# Patient Record
Sex: Female | Born: 1975 | Race: White | Hispanic: Yes | Marital: Single | Smoking: Former smoker
Health system: Southern US, Community
[De-identification: ages and names within clinical notes are randomized; demographics above are authoritative.]

## PROBLEM LIST (undated history)

## (undated) DIAGNOSIS — O139 Gestational [pregnancy-induced] hypertension without significant proteinuria, unspecified trimester: Secondary | ICD-10-CM

## (undated) DIAGNOSIS — B977 Papillomavirus as the cause of diseases classified elsewhere: Secondary | ICD-10-CM

## (undated) DIAGNOSIS — T7840XA Allergy, unspecified, initial encounter: Secondary | ICD-10-CM

## (undated) DIAGNOSIS — N890 Mild vaginal dysplasia: Secondary | ICD-10-CM

## (undated) HISTORY — PX: MYOMECTOMY ABDOMINAL APPROACH: SUR870

## (undated) HISTORY — DX: Papillomavirus as the cause of diseases classified elsewhere: B97.7

## (undated) HISTORY — DX: Allergy, unspecified, initial encounter: T78.40XA

## (undated) HISTORY — DX: Mild vaginal dysplasia: N89.0

---

## 2000-01-28 ENCOUNTER — Other Ambulatory Visit: Admission: RE | Admit: 2000-01-28 | Discharge: 2000-01-28 | Payer: Self-pay | Admitting: Gynecology

## 2001-12-13 ENCOUNTER — Inpatient Hospital Stay (HOSPITAL_COMMUNITY): Admission: EM | Admit: 2001-12-13 | Discharge: 2001-12-15 | Payer: Self-pay | Admitting: Psychiatry

## 2002-07-05 ENCOUNTER — Other Ambulatory Visit: Admission: RE | Admit: 2002-07-05 | Discharge: 2002-07-05 | Payer: Self-pay | Admitting: Gynecology

## 2002-08-20 ENCOUNTER — Inpatient Hospital Stay (HOSPITAL_COMMUNITY): Admission: RE | Admit: 2002-08-20 | Discharge: 2002-08-22 | Payer: Self-pay | Admitting: Gynecology

## 2002-08-20 ENCOUNTER — Encounter (INDEPENDENT_AMBULATORY_CARE_PROVIDER_SITE_OTHER): Payer: Self-pay

## 2005-04-21 ENCOUNTER — Other Ambulatory Visit: Admission: RE | Admit: 2005-04-21 | Discharge: 2005-04-21 | Payer: Self-pay | Admitting: Gynecology

## 2005-06-01 ENCOUNTER — Ambulatory Visit (HOSPITAL_BASED_OUTPATIENT_CLINIC_OR_DEPARTMENT_OTHER): Admission: RE | Admit: 2005-06-01 | Discharge: 2005-06-01 | Payer: Self-pay | Admitting: Gynecology

## 2005-06-01 HISTORY — PX: OTHER SURGICAL HISTORY: SHX169

## 2005-12-06 ENCOUNTER — Other Ambulatory Visit: Admission: RE | Admit: 2005-12-06 | Discharge: 2005-12-06 | Payer: Self-pay | Admitting: Gynecology

## 2006-07-13 ENCOUNTER — Other Ambulatory Visit: Admission: RE | Admit: 2006-07-13 | Discharge: 2006-07-13 | Payer: Self-pay | Admitting: Gynecology

## 2007-01-19 ENCOUNTER — Encounter (INDEPENDENT_AMBULATORY_CARE_PROVIDER_SITE_OTHER): Payer: Self-pay | Admitting: Specialist

## 2007-01-19 ENCOUNTER — Inpatient Hospital Stay (HOSPITAL_COMMUNITY): Admission: RE | Admit: 2007-01-19 | Discharge: 2007-01-22 | Payer: Self-pay | Admitting: Gynecology

## 2007-02-22 ENCOUNTER — Other Ambulatory Visit: Admission: RE | Admit: 2007-02-22 | Discharge: 2007-02-22 | Payer: Self-pay | Admitting: Gynecology

## 2009-05-13 ENCOUNTER — Ambulatory Visit: Payer: Self-pay | Admitting: Gynecology

## 2009-10-22 ENCOUNTER — Ambulatory Visit: Payer: Self-pay | Admitting: Gynecology

## 2009-10-22 ENCOUNTER — Other Ambulatory Visit: Admission: RE | Admit: 2009-10-22 | Discharge: 2009-10-22 | Payer: Self-pay | Admitting: Gynecology

## 2011-02-10 ENCOUNTER — Other Ambulatory Visit: Payer: Self-pay | Admitting: Gynecology

## 2011-02-10 ENCOUNTER — Other Ambulatory Visit (HOSPITAL_COMMUNITY)
Admission: RE | Admit: 2011-02-10 | Discharge: 2011-02-10 | Disposition: A | Discharge status: Home / Self Care | Payer: Self-pay | Source: Ambulatory Visit | Attending: Gynecology | Admitting: Gynecology

## 2011-02-10 ENCOUNTER — Encounter (INDEPENDENT_AMBULATORY_CARE_PROVIDER_SITE_OTHER): Payer: Self-pay | Admitting: Gynecology

## 2011-02-10 DIAGNOSIS — Z01419 Encounter for gynecological examination (general) (routine) without abnormal findings: Secondary | ICD-10-CM

## 2011-02-10 DIAGNOSIS — R5383 Other fatigue: Secondary | ICD-10-CM

## 2011-02-10 DIAGNOSIS — Z833 Family history of diabetes mellitus: Secondary | ICD-10-CM

## 2011-02-10 DIAGNOSIS — Z13220 Encounter for screening for lipoid disorders: Secondary | ICD-10-CM

## 2011-02-10 DIAGNOSIS — Z124 Encounter for screening for malignant neoplasm of cervix: Secondary | ICD-10-CM | POA: Insufficient documentation

## 2011-04-08 NOTE — Op Note (Signed)
NAME:  Brittany Frost, Brittany Frost NO.:  [id number]   MEDICAL RECORD NO.:  [id number]          PATIENT TYPE:  AMB   LOCATION:  [facility]                         FACILITY:  [facility]   PHYSICIAN:  Juan H. Lily Peer, M.D.DATE OF BIRTH:  13-Dec-1975   DATE OF PROCEDURE:  06/01/2005  DATE OF DISCHARGE:                                 OPERATIVE REPORT   INDICATIONS FOR OPERATION:  A 35 year old gravida 2, para 0, AB 2 with  abnormal Pap smear in the office demonstrated CIN I/VAIN I with HPV changes  and hybrid capture demonstrated high risk HPV. The patient underwent  colposcopic directed biopsy which demonstrated in the right and left vaginal  anterior fornix VAIN I and and no ectocervical lesions were noted and ECC  was benign. She also had perirectal and perineum condyloma acuminatum as  well as near the area of the fourchette. Her ECC was benign.   PREOPERATIVE DIAGNOSES:  1.  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  Perineal and perirectal condyloma acuminatum.   ANESTHESIA:  General endotracheal anesthesia.   SURGEON:  Juan H. Lily Peer, M.D.   PROCEDURE PERFORMED:  Laser ablation of vulvar intraepithelial neoplasia I  of the anterior right and left vaginal fornix as well as laser ablation of  perineal and perirectal and vulvar condyloma acuminatum.   FINDINGS:  Patient with scattered condyloma acuminatum perirectal, perineum  and right and left labia majora near the area of the mons pubis.   DESCRIPTION OF OPERATION:  After the patient was adequately counseled, she  was taken to the operating room where she underwent a successful general  endotracheal anesthesia. She was placed in a high lithotomy position. Wet  drapes were placed around the vulva. A wet sponge was placed in the rectum.  Acetic acid was applied to the external genitalia and with a handheld piece  and the CO2 laser set at 10 watts with a 2 mm spot size, the external  genital lesions were  ablated/condylomas. The attention was then placed to  the vaginal vault were a titanium coated speculum was inserted. Acetic acid  was applied. A thorough inspection of the vagina and cervix demonstrated the  previous biopsy sites in the anterior vaginal fornix, the acetowhite areas  at the anterior right and left lateral vaginal fornices which were laser  ablated with similar a 10 watt setting in a brush like technique to an  approximately 2 mm depth. Amino-Cerv cream was applied intravaginally as  well as the external genitalia. The patient was awakened, transferred to  recovery room with stable vital signs. She received 30 mg of Toradol in  route to the recovery room.   IV FLUIDS:  Total 900 mL .   BLOOD LOSS:  Minimal.       JHF/MEDQ  D:  06/01/2005  T:  06/01/2005  Job:  [id number]

## 2011-04-08 NOTE — H&P (Signed)
NAME:  Brittany Frost, Brittany Frost NO.:  [id number]   MEDICAL RECORD NO.:  [id number]          PATIENT TYPE:  INP   LOCATION:  NA                            FACILITY:  [facility]   PHYSICIAN:  Juan H. Lily Peer, M.D.DATE OF BIRTH:  June 17, 1976   DATE OF ADMISSION:  DATE OF DISCHARGE:                              HISTORY & PHYSICAL   CHIEF COMPLAINT:  1. Term intrauterine pregnancy.  2. Previous history of myomectomy.  3. Labile pregnancy-induced hypertension.   HISTORY OF PRESENT ILLNESS:  The patient is a 35 year old gravida 2,  para 0, AB 1, with corrected estimated date of confinement of February 03, 2007.  Patient scheduled to undergo a primary cesarean section this  Friday, February 29, whereby the patient will be 28 weeks' gestation.  Her surgery originally had been scheduled for Thursday, March 6, but due  to her labile PIH it was moved up.  The patient's prenatal course is  significant for the fact that she had been scheduled for a cesarean  section due to the fact that she had an abdominal myomectomy in the  past.  Her prenatal care consisted of a normal first trimester screen.  She declined a cystic fibrosis screen, and alpha-fetoprotein was normal.  She had had early in her pregnancy a subchorionic hematoma, which  eventually had resolved.  She had been followed also with ultrasounds  for growth and most recent ultrasound in the office was on February 27,  whereby she had a biophysical profile because her blood pressures were  as follows:  160/110 and 130/90, but with rest it went down to 110/80  and 112/80.  Her DTRs were 1+, no clonus, no edema.  She was totally  asymptomatic.  Biophysical profile was 8/8 and her NST was reactive.  PIH labs pending at time of dictation.   PAST MEDICAL HISTORY:  Patient with previous abdominal myomectomy  whereby the intrauterine cavity had been entered and large-sized  fibroids and to prevent uterine rupture, this is the reason for  the  scheduling of the cesarean section.   She denies any allergies, and no other medical problems were reported.   REVIEW OF SYSTEMS:  See [employer] form.   PHYSICAL EXAMINATION:  VITAL SIGNS:  The patient weighs 172 pounds.  Last blood pressure reading in the office 112/80.  HEENT:  Unremarkable.  NECK:  Supple.  Trachea midline.  No carotid bruits.  No thyromegaly.  LUNGS:  Clear to auscultation without rhonchi or wheezes.  CARDIAC:  Regular rate and rhythm, no murmur or gallop.  BREASTS:  Exam not done.  ABDOMEN:  Gravid uterus.  Pfannenstiel skin incision from previous  abdominal myomectomy.  PELVIC:  Bartholin, urethra and Skene glands within normal limits.  Vagina and cervix:  No lesions or discharge.  Cervix long and closed.  Gravid uterus.  EXTREMITIES:  DTR 1+, negative clonus, trace edema.   PRENATAL LABORATORY DATA:  O positive blood type, negative antibody  screen.  VDRL was nonreactive.  Rubella immune.  Hepatitis B surface  antigen and HIV were negative.  Alpha-fetoprotein was normal and  GBS  culture was negative.   ASSESSMENT:  A 35 year old gravida 2, para 0, AB 1, who is scheduled to  undergo primary cesarean section on Friday, February 29, secondary to  labile pregnancy-induced hypertension and prior history of abdominal  myomectomy.  The patient will be 35 weeks into her pregnancy.  The risks  and benefits and pros and cons of the operation were discussed and  explained to the patient in Spanish.  All questions were answered, and  will follow accordingly.      Juan H. Lily Peer, M.D.  Electronically Signed     JHF/MEDQ  D:  01/17/2007  T:  01/17/2007  Job:  [id number]

## 2011-04-08 NOTE — Discharge Summary (Signed)
[facility]  Patient:    Brittany Frost, Brittany Frost Visit Number: [id number] MRN: [id number]          Service Type: GYN Location: *N Attending Physician:  Tonye Royalty Dictated by:   Reymundo Poll Dub Mikes, M.D. Adm. Date:  12/13/01 Disc. Date: 12/15/01                             Discharge Summary  CHIEF COMPLAINT AND PRESENT ILLNESS:  This was the first admission to [facility] for this 35 year old Hispanic female, who was admitted due to increased depression.  The patient overdosed on Zoloft after arguing with her boyfriends sister at work.  They had made fun of her.  She was upset when her boyfriend chose to side with his sister rather than with her.  She took 20-25 tablets of Zoloft.  Seen in the emergency department, medically cleared and transferred.  There is work pressure from her supervisor as an additional stressor in her life.  Three to four weeks of progressive anhedonia, poor sleep secondary to chronic worry, reclusiveness, not wanting to get up in the morning, get dressed or do anything, decreased appetite, no specific weight loss, crying, sadness for the past two or three weeks.  No auditory or visual hallucinations.  PAST PSYCHIATRIC HISTORY:  OB/GYN placed her on Zoloft.  Initially started on 25 mg, then 50 mg.  She discontinued in December.  On the 50 mg for six weeks, she saw decreased benefit.  ALCOHOL/DRUG HISTORY:  Denies the use or abuse of any substances.  MEDICAL HISTORY:  Dysmenorrhea not otherwise specified and anemia.  MEDICATIONS:  Megace 10 mg twice a day, Chromagen 1 tab daily, Zoloft (which she was not compliant).  MENTAL STATUS EXAMINATION:  Fully alert female, polite, cooperative in no acute distress.  Quite blunted affect.  Tearful at times.  Speech was normal and relevant, spontaneous, appropriate.  Mood is quite depressed, mildly hopeless.  Thought processes are logical and goal directed.  Some  suicidal ideation but can promise safety.  No psychosis.  ADMISSION DIAGNOSES: Axis I:    Major depression, recurrent. Axis II:   No diagnosis. Axis III:  1. Anemia.            2. Dysmenorrhea. Axis IV:   Moderate. Axis V:    Global Assessment of Functioning upon admission 40-45; highest            Global Assessment of Functioning in the last year 78.  LABORATORY DATA:  Serum electrolytes within normal limits.  Thyroid profile within normal limits.  Drug screen was negative for substances of abuse.  EKG was within normal limits.  HOSPITAL COURSE:  She was admitted and started intensive individual and group psychotherapy.  She was able to share the fact that the ongoing conflict in the relationship with the family of her husband is causing increased stress on her.  She was not getting much benefit from the Zoloft, so we went ahead and started Lexapro 5 mg, to increase to 10 mg.  We met with the husband.  They were willing to look into their issues.  She just wanted some validation from her husband that he is willing to work on their relationship.  On December 15, 2001, she was in full contact with reality.  Mood improved.  Affect brighter, more hopeful.  Tolerating the Lexapro well.  We went ahead and discharged to outpatient follow-up.  DISCHARGE  DIAGNOSES: Axis I:    Major depression, recurrent. Axis II:   No diagnosis. Axis III:  1. Anemia.            2. Dysmenorrhea. Axis IV:   Moderate. Axis V:    Global Assessment of Functioning upon discharge 55-60.  DISCHARGE MEDICATIONS:  Lexapro 10 mg daily.  FOLLOW-UP:  To follow up with this physician. Dictated by:   Reymundo Poll Dub Mikes, M.D. Attending Physician:  Tonye Royalty DD:  01/23/02 TD:  01/26/02 Job: [id number]

## 2011-04-08 NOTE — Op Note (Signed)
NAME:  Brittany Frost, Brittany Frost NO.:  [id number]   MEDICAL RECORD NO.:  [id number]          PATIENT TYPE:  INP   LOCATION:  9103                          FACILITY:  [facility]   PHYSICIAN:  Juan H. Lily Peer, M.D.DATE OF BIRTH:  Nov 04, 1976   DATE OF PROCEDURE:  01/19/2007  DATE OF DISCHARGE:                               OPERATIVE REPORT   SURGEON:  Juan H. Lily Peer, M.D.   INDICATIONS FOR PROCEDURE:  The patient is a 35 year old gravida 2, para  0, abortion 1, with previous myomectomy.  She was scheduled for a  primary cesarean section for next week.  She was moved up to this week,  due to the fact that she had labile PIH.  Recent PIH labs were normal.   PREOPERATIVE DIAGNOSES:  1. Term intrauterine pregnancy.  2. Previous history of myomectomy.  3. Labile pregnancy-induced hypertension.   POSTOPERATIVE DIAGNOSES:  1. Term intrauterine pregnancy.  2. Previous history of myomectomy.  3. Labile pregnancy-induced hypertension.   ANESTHESIA:  Spinal.   PROCEDURE:  Primary lower uterine segment transverse cesarean section.   FINDINGS:  A viable female infant with Apgars of 9 and 9, weighing 6  pounds 1 ounce.  A patient with anterior fundal 4 cm broad-based trans-  mural myoma noted and several intra-mural myomas were noted and  extensive pelvic adhesions.  Normal-appearing ovaries and tubes.   DESCRIPTION OF PROCEDURE:  After the patient was adequately counseled,  she was taken to the operating room where she underwent a successful  spinal anesthesia.  Her abdomen was prepped and draped in the usual  sterile fashion.  A Foley catheter was inserted in order to monitor her  urinary output.  A Pfannenstiel skin incision was made adjacent to the  previous Pfannenstiel incision.  The incision was carried down through  the skin and subcutaneous tissue, down to the rectus fascia, where a  midline nick was made.  The fascia was incised in a transverse fashion.  The midline raphe  was entered.  The peritoneal cavity was entered  cautiously.  The bladder flap was established.  The lower uterine  segment was closed in a transverse fashion.  Clear amniotic fluid was  present.  The newborn's head was delivered.  The nasopharyngeal area was  bulb suctioned.  The newborn was delivered.  The cord was doubly clamped  and excised.  The newborn was passed off to the neonatologists who were  in attendance, who gave the above-mentioned parameter.  The newborn gave  an immediate cry.  After the cord blood was obtained, the patient's cord  blood from the fetus/newborn was collected and would be sent to cord  blood registry for probably cord blood storage.  Once this was  completed, the placenta was delivered from the intra-uterine cavity and  was submitted for histological evaluation.  The uterus was unable to be  exteriorized.  A Pitocin drip was started.  The intrauterine cavity was  swept clear of the remaining products of conception.  The uterine  transverse incision was closed with a double locking suture.  The first  stitch  was of #0 Vicryl in a locking stitch manner.  The second one  followed in an imbricating manner with #0 Vicryl suture.  The pelvic  cavity was copiously irrigated with normal saline solution.  After  ascertaining hemostasis, closure was started,  The visceral peritoneum  was not reapproximated but the rectus fascia was closed with a running  stitch of #0 Vicryl suture.  The subcutaneous bleeders were Bovie  cauterized.  The skin was reapproximated with skin clips, followed by  placement of Xeroform gauze and 4 x 4 dressing.  Of note, prior to  making the Pfannenstiel incision, 0.25% Marcaine was infiltrated in the  Pfannenstiel incision site, a total of 10 mL, for postoperative  analgesia.   The patient was transferred to the recovery room with stable vital  signs.  The blood loss for the procedure was recorded at 800 mL.  IV  fluids 3200 mL of  lactated Ringer's.  The urine output was 700 mL and  clear.      Juan H. Lily Peer, M.D.  Electronically Signed     JHF/MEDQ  D:  01/19/2007  T:  01/19/2007  Job:  [id number]

## 2011-04-08 NOTE — Op Note (Signed)
NAME:  Brittany Frost, Brittany Frost                          ACCOUNT NO.:  [id number]   MEDICAL RECORD NO.:  [id number]                   PATIENT TYPE:  INP   LOCATION:  9399                                 FACILITY:  [facility]   PHYSICIAN:  Juan H. Lily Peer, M.D.             DATE OF BIRTH:  1976-07-05   DATE OF PROCEDURE:  08/20/2002  DATE OF DISCHARGE:                                 OPERATIVE REPORT   SURGEON:  Juan H. Lily Peer, M.D.   FIRST ASSISTANT:  Devin M. Ciliberti, M.D.   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   PREOPERATIVE DIAGNOSES:  1. Leiomyomatous uteri.  2. Menometrorrhagia.  3. Anemia.   POSTOPERATIVE DIAGNOSES:  1. Leiomyomatous uteri.  2. Menometrorrhagia.  3. Anemia.   PROCEDURE:  General endotracheal anesthesia.   PROCEDURE PERFORMED:  Abdominal myomectomy.   FINDINGS:  An 8 x 7 x 7 cm intramural myoma encroaching into the endometrium  with fleshy/polypoid attachments projecting into the intrauterine cavity.  Normal fallopian tubes and ovaries.   DESCRIPTION OF OPERATION:  After the patient was adequately counseled she  was taken to the operating room where she underwent a successful general  endotracheal anesthesia.  She was placed in a supine position after a Foley  catheter was inserted.  The abdomen was prepped and draped in the usual  sterile fashion.  A Pfannenstiel skin incision was made 2 cm above the  symphysis pubis.  The incision was carried down from the skin and  subcutaneous tissue down to the rectus fascia where a midline nick was made.  The fascia was incised in a transverse fashion.  The peritoneal cavity was  entered cautiously.  The patient was placed in Trendelenburg position.  The  O'Connor-O-Sullivan retractors were in place and immediately identification  of the uterus demonstrated an enlarged intramural  myoma measuring 8 x 7 x 7  cm.  Pitressin in a 1:1 dilution was infiltrated into the uterine serosa and  in the fundal aspect of the uterus a vertical incision was made and the  large intramural leiomyoma was enucleated.  It was interesting to find that  fleshy like material was found attached to this myoma and it was encroaching  into the intrauterine cavity which had to be entered in order to remove the  entire myoma.  This was submitted for frozen and the pathologist called back  that it looked like it was an adenomyoma, but benign features.  No malignant  features or atypicality were noted on the frozen.  The uterus was then  closed with a first row of interrupted sutures of 3-0 Vicryl suture of the  myometrium and then the remaining layers of the myometrium up to the serosa  was closed with an embrocating suture of 3-0 Vicryl.  For postoperative  adhesion prevention INTERCEED was placed over the incision site.  Both tubes  appeared to be normal in contour.  Fimbrias were normal and lush.  Ovaries  were normal.  Cul-de-sac was free of adhesions or endometriosis.  Of note,  before placing INTERCEED the pelvic cavity was copiously irrigated with  normal saline solution and sponge count and needle count were correct.  The  O'Connor-O-Sullivan retractor was removed.  The rectus fascia was closed  with a running stitch of 0 Vicryl suture.  The subcutaneous bleeders were  Bovie cauterized.  The skin was reapproximated with skin clips followed by  placing Xeroform gauze with 4x dressing.  The patient was extubated,  transferred to recovery room with stable vital signs.  Blood loss was 200  cc.  IV fluids was 1500 cc lactated Ringer's.  Urine output 175 cc of clear.  The patient received 1 g Cefotan preoperatively.                                               Juan H. Lily Peer, M.D.    JHF/MEDQ  D:  08/20/2002  T:  08/20/2002  Job:  [id number]

## 2011-04-08 NOTE — H&P (Signed)
NAME:  ANATALIA, KRONK NO.:  [id number]   MEDICAL RECORD NO.:  [id number]          PATIENT TYPE:  AMB   LOCATION:  [facility]                         FACILITY:  [facility]   PHYSICIAN:  Juan H. Lily Peer, M.D.DATE OF BIRTH:  [birth year]   DATE OF ADMISSION:  06/01/2005  DATE OF DISCHARGE:                                HISTORY & PHYSICAL   Patient is scheduled for surgery tomorrow at 8:30 a.m. at [facility].   CHIEF COMPLAINT:  1.  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  2.  Perirectal and fourchette condylomata acuminata.   HISTORY:  The patient is a 35 year old gravida 2, para 0, AB 2 who was seen  in the office for her annual exam on April 21, 2005.  She had not been seen in  the office for over a three-year period.  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  She underwent culposcopic-directed biopsy, which demonstrated the  right vaginal fornix, low grade squamous intraepithelial lesion (VAIN/I).  She also had left anterior vaginal fornix low-grade squamous intraepithelial  lesion (VAIN/I), and she had perirectal and perineum biopsies demonstrating  condylomata acuminatum as well as in the area of the fourchette.  The ECC  was benign.  Patient is scheduled to undergo CO2 laser ablation of the  vaginal intraepithelial lesion and perirectal, perineum, and fourchette  condylomata acuminatum.   PAST MEDICAL HISTORY:  The patient denies any allergies.  She is using  condoms for contraception.  She has had two pregnancies and two abortions.  She denies any medical problems.  No family history of any medical problems  reported.  No prior medical history with the exception of a myomectomy that  she had here in [place] in 2003.   PHYSICAL EXAMINATION:  VITAL SIGNS:  Patient weighs 129 pounds.  Blood  pressure 110/60.  HEENT:  Unremarkable.  NECK:   Supple.  Trachea is midline.  No carotid bruits.  No thyromegaly.  LUNGS:  Clear to auscultation without rhonchi or wheezes.  HEART:  Regular rate and rhythm.  No murmurs, rubs or gallops.  BREASTS:  Not done.  ABDOMEN:  Soft and nontender without rebound or guarding.  PELVIC:  Bartholin's, urethra, and Skene's glands, perirectal, fourchette  region as described above.  Vagina and cervix as described above.  Bimanual  exam:  Anteverted uterus.  Normal size, shape, and consistency.  Adnexa  without masses or tenderness.   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  Also, perirectal and perineum condylomata acuminata as well  as the area of the fourchette with benign endocervical curettage.  Patient  will undergo CO2 laser ablation of the dysplasia as well as the condylomata  acuminata.  The risks, benefits and pro's and con's of the procedure were  discussed with the patient, and literature information had been provided.  All questions were answered and will follow  accordingly.   PLAN:  Patient is scheduled for CO2 laser ablation of vaginal dysplasia and  external genital and perirectal condylomata acuminata on Wednesday, July  12th at 8:30 a.m. at [facility].       JHF/MEDQ  D:  05/31/2005  T:  05/31/2005  Job:  [id number]

## 2011-04-08 NOTE — Discharge Summary (Signed)
   NAME:  Brittany Frost, Brittany Frost NO.:  [id number]   MEDICAL RECORD NO.:  [id number]                   PATIENT TYPE:   LOCATION:                                       FACILITY:   PHYSICIAN:  Juan H. Lily Peer, M.D.             DATE OF BIRTH:   DATE OF ADMISSION:  08/20/2002  DATE OF DISCHARGE:  08/22/2002                                 DISCHARGE SUMMARY   DISCHARGE DIAGNOSES:  1. Leiomyomatous uteri.  2. Menometrorrhagia.  3. Anemia.   PROCEDURE:  Abdominal myomectomy.   HISTORY OF PRESENT ILLNESS:  The patient is a 35 year old gravida 4, para 0,  AB4.  She has been followed for menometrorrhagia complicated by anemia and  in addition to this has a history of four pregnancies and four miscarriages.  She denies any allergies.  Denies alcohol use, cigarette smoking, and has  previously been taking Ambien 10 mg q.h.s., Zoloft 50 mg for depression and  anxiety, Megace 20 mg t.i.d., and iron supplements.  She was also noted  sonohysterogram to have multiple endometrial polyps.  She was admitted for  abdominal myomectomy.   HOSPITAL COURSE:  The patient was admitted, taken to the OR.  An abdominal  myomectomy was performed by Dr. Lily Peer, Dr. Penni Homans.  Findings included  an 8 x 7 x 7 cm intramural myoma encroaching into the endometrium with  fleshy polypoid attachments projecting into the intrauterine cavity.  Normal  fallopian tubes and ovaries.  Postoperatively patient had donated 1 unit of  autologous blood and she was transfused.  Postoperatively her hemoglobin was  8.2.  She otherwise did well postoperatively and was able to be discharged  on her second postoperative day.  CBC:  Hematocrit 26.7, hemoglobin 8.8, WBC  7.8, platelets 268,000 after transfusion.   DISPOSITION:  Follow up in the office for staple removal.  Megace q.d.  Iron  b.i.d.  Lortab 7.5/500 mg one p.o. q.4-6h.     Elwyn Lade . Hancock, N.P.                Gaetano Hawthorne. Lily Peer,  M.D.    MKH/MEDQ  D:  09/16/2002  T:  09/16/2002  Job:  [id number]

## 2011-04-08 NOTE — Discharge Summary (Signed)
NAME:  Brittany Frost, WAGGONER NO.:  [id number]   MEDICAL RECORD NO.:  [id number]          PATIENT TYPE:  INP   LOCATION:  9103                          FACILITY:  [facility]   PHYSICIAN:  Juan H. Lily Peer, M.D.DATE OF BIRTH:  02-04-1976   DATE OF ADMISSION:  01/19/2007  DATE OF DISCHARGE:  01/22/2007                               DISCHARGE SUMMARY   HISTORY OF PRESENT ILLNESS:  The patient is a 35 year old gravida 2,  para 0, abortus 1 with past history of myomectomy.  The patient was  scheduled to undergo primary cesarean section was moved week earlier  secondary to the patient's labile PIH.   HOSPITAL COURSE:  The patient underwent a primary lower uterine segment  transverse cesarean section by Dr. Gaetano Hawthorne. Lily Peer in the afternoon of  February 29. The patient delivered a viable female infant, Apgars of 9 and  9 with a weight of 6 pounds 1 ounce. The patient's postpartum day #1 her  hemoglobin/hematocrit were 11.2 and 33.3 respectively with platelet  count 165,000.  Her blood type was O+.  She was rubella immune.  Her  vital signs were stable.  Her Foley catheter had been discontinued since  it was in place overnight.  She had an adequate urinary output.  She was  started on a clear liquid diet was advanced with a regular diet  gradually and she was ready to be discharged home on the third  postoperative day. Her incision site was intact and passing gas and had  bowel movement, tolerating regular diet.   FINAL DIAGNOSIS:  1. Term intrauterine pregnancy delivered.  2. Prior myomectomy.  3. Labile pregnancy induced hypertension.  4. Postoperative anemia.   PROCEDURE PERFORMED:  Primary lower uterine segment transverse cesarean  section.   FINAL DISPOSITION/FOLLOW UP:  The patient was discharged home on the  third postop day. She was up ambulating, tolerating a regular diet well  and passing gas and had bowel movements and was ready to be discharged  home.   DISCHARGE  MEDICATIONS:  She was discharged home on  1. Motrin 800 mg t.i.d. p.r.n.  2. Tylox one p.o. q.4-6 hours p.r.n. pain.  3. Reglan 10 mg b.i.d. to help with her breast milk production.   The patient scheduled to return next week to have her staples removed.  All instructions were provided in Spanish and will follow accordingly.     Juan H. Lily Peer, M.D.  Electronically Signed    JHF/MEDQ  D:  01/22/2007  T:  01/22/2007  Job:  [id number]

## 2011-04-08 NOTE — H&P (Signed)
[facility]  Patient:    Brittany Frost, Brittany Frost Visit Number: [id number] MRN: [id number]          Service Type: PSY Location: 300 [id number] Attending Physician:  Rachael Fee Dictated by:   Young Berry Scott, N.P. Admit Date:  12/13/2001 Discharge Date: 12/15/2001                     Psychiatric Admission Assessment  DATE OF ASSESSMENT:  December 13, 2001 at 3 p.m.  IDENTIFYING INFORMATION:  This is a 35 year old Hispanic female who is single. She is a voluntary admission.  HISTORY OF THE PRESENT ILLNESS:  This patient who has been in this country approximately 8 years from [place] reports conflict with her boyfriends sister who made fun of her for not being able to conceive.  The patient overdosed on Zoloft today after arguing with her boyfriends sisters at work.  They had made fun of her and she was upset when her boyfriend chose to side with his sisters rather than with her.  The patient reportedly took approximately 20-25 tablets of Zoloft 50 mg.  She was seen in the emergency department, medically cleared and transferred for admission.  The patient took the overdose at approximately 5:30 p.m. and was treated with charcoal and lavage in the emergency room by 6:30 p.m.  The patient also cites an addition to recent stressors and conflict with her boyfriends sisters.  She also cites work pressure from her supervisor as an additional stressor in her life.  She endorses 3-4 weeks of progressive anhedonia, poor sleep secondary to chronic worry, reclusiveness, not wanting to get up in the morning and get dressed or do anything.  She reports decreased appetite, but no specific weight loss. She has had increased episodes of crying and sadness for the past 2-3 weeks. The patient denies any specific auditory or visual hallucinations.  She is distressed by sleeping only approximately 4-5 hours per night for the past 3-4 weeks.  She denies any homicidal  ideation.  PAST PSYCHIATRIC HISTORY:  The patient is treated her her OB-GYN for her depression since October, 2002 with Zoloft.  She reports that she was initially started on 25 mg and had a good response to the Zoloft, being able to sleep better, think more clearly and felt good.  Then, her dosage was increased to 50 mg in December and she continued to feel fine.  She has been on the 50 mg approximately 6 weeks, but about 4 weeks ago she noticed diminishing effect with increasing symptoms of depression over the past 2-3 weeks.  She has continued to be compliant with the Zoloft throughout this time by her own report.  She denies any history of mania or mood swings.  SOCIAL HISTORY:  The patient was educated in [place] through high school and also had some additional community college courses while in [place].  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  She currently lives with her 2 sisters who are age 58 and 69.  Her parents live in [place].  She also has been living with this boyfriend as part of her household for the past 5 years and she has known him 7 years.  She feels that he truly loves her and she plans to continue the relationship, but she was hurt by  his remarks yesterday that his own family counts as #1, #2 and #3 in her life and for this reason, the patient felt great despair.  The patient also cites an additional stressor that she feels responsible for her 47 year old sister who is currently failing the 9th grade in school in [place].  FAMILY HISTORY:  Remarkable for a mother with a history of depression.  ALCOHOL AND DRUG HISTORY:  The patient reports that she occasionally drinks alcohol socially.  The last time was in October.  She denies any other substance abuse.  MEDICAL HISTORY:  The patient is followed by Dr.  Reynaldo Minium who is her OB-GYN physician.  Medical problems are dysmenorrhea, NOS, and anemia, NOS. The patient is scheduled for surgery, some type of surgery on her uterus.  She reports that she has some type of tumor there which is unclear to [place].  CURRENT MEDICATIONS: 1. Zoloft 50 mg p.o. q.d. since early December 2002. 2. Megace 10 mg b.i.d. 3. Chromagen 1 tab daily.  DRUG ALLERGIES:  None.  POSITIVE PHYSICAL FINDINGS:  The patients physical exam was done in the emergency room and was generally unremarkable.  The patient was treated there with charcoal and lavage.  At that time, her alcohol level was less than 5. It is noted that she does have some mild anemia as noted on her CBC which is rbcs are 5.13, hemoglobin is 12.4, hematocrit is 38.0, MCV is 73.9.  RDW 28.4, platelet count 314, MCHC 32.7, WBC 9.1.  The patients routine chemistries are within normal limits with a potassium at 3.6.  Other electrolytes normal.  Her SGOT is 17, SGPT 22, BUN 8, creatinine 0.7. The patients acetaminophen level in the emergency room was 1.2. Her urine drug screen is noted at negative.  Her alcohol level was less than 5.  The patients thyroid panel was within normal limits with a TSH of 1.19.  VITAL SIGNS:  On admission to the unit, temperature 98.9, pulse 91, respirations 18, blood pressure 126/89.  She is approximately 5 feet 1 inches tall and weighs 130 pounds.  MENTAL STATUS EXAMINATION:  This is a fully alert, healthy appearing Hispanic female who has a polite and cooperative manner.  She is in no acute distress. She does have quite a blunted affect which is tearful at times as she discusses her situation.  Speech is normal and relevant.  It is spontaneous and appropriate.  Mood is quite depressed, mildly hopeless.  Thought process is logical and goal directed.  She is positive for some suicidal ideation, but promises safety on the unit.  No evidence of homicidal ideation.  No psychosis.   Cognitively, she is intact and oriented x4.  ADMITTING DIAGNOSES: Axis I:    Major depression, single episode, severe. Axis II:   None.  Axis III:  1. Anemia, not otherwise specified.            2. Dysmenorrhea, not otherwise specified. Axis IV:   Moderate, problems with a primary support group, specifically            conflict with her boyfriends family and work stress. Axis V:    Current 32, past year 2.  PLAN:  Voluntarily admit the patient to stabilize her mood with q.15 minute checks in place and her goal is to alleviate her suicidal ideation.  The patients urinary pregnancy test is currently pending.  We will allow outside visiting hours for her boyfriend.  We will refer her back to her primary care  Edwin Baines for followup on her anemia and meanwhile will restart her routine medications and put her on trazodone 25 mg p.o. q.h.s. and have elected to restart her Zoloft at 75 mg p.o. q.d. and last for a family session with her boyfriend prior to discharge.  Estimated length of stay is 2-4 days. Dictated by:   Young Berry Scott, N.P. Attending Physician:  Rachael Fee DD:  12/14/01 TD:  12/15/01 Job: [phone] [id number]

## 2011-04-08 NOTE — H&P (Signed)
NAME:  Brittany Frost, Brittany Frost                          ACCOUNT NO.:  [id number]   MEDICAL RECORD NO.:  [id number]                   PATIENT TYPE:  INP   LOCATION:  NA                                   [facility]   PHYSICIAN:  Juan H. Lily Peer, M.D.             DATE OF BIRTH:  1976-07-01   DATE OF ADMISSION:  08/20/2002  DATE OF DISCHARGE:                                HISTORY & PHYSICAL   CHIEF COMPLAINT:  Menometrorrhagia and leiomyomatous uteri.   HISTORY OF PRESENT ILLNESS:  The patient is a 35 year old, gravida 4, para  0, AB 4, who was seen in the office on July 05, 2002, for her annual exam.  Her last Pap smear had been done in our office in March of 2001.  She had  been seen secondary to a history of menometrorrhagia contributing to her  anemia.  She had a sonohysterogram in 2002 where multiple endometrial polyps  were seen.  She had had a D&C and removal of some polyps at [facility].  She continued bleeding and had been placed on Megace 20 mg b.i.d.  for three weeks.  For depression and anxiety, she had been placed on Zoloft  50 mg q.d.  For insomnia, she has been taking Ambien as well.  For anemia,  she was on supplemental iron.  She had been placed on Lupron 11.25 mg on  August 31, 2001, but due to the fact that she was still having bleeding,  Megace was added to the regimen in order to raise her hemoglobin.  She was  scheduled in January to undergo an abdominal myomectomy and possible  rectoscopic surgery as well.  At the time of the ultrasound, it was  demonstrated on sonohysterogram that she had large intramural myoma and the  uterine fundus was measured 7.9 x 6.1 x 7.5 mm.  She took her Megace, but  stated that due to the fact that her insurance would not pay for her surgery  at that time as they thought it to be a preexisting disease, she had held  off her surgery.  She stopped bleeding and she did not take the iron tablets  for a while, but did not continue  after she ran out of the Megace, which had  helped stop her bleeding.  Her hemoglobin on September 25, 2001, had dropped  down to 9.4.  She was on Megace and iron tablets and her hemoglobin returned  back to 11.5.  On July 05, 2002, we checked her CBC and her hemoglobin was  down to 9.6 and hematocrit 30.4.  She continued to have heavy periods.  She  stated that after checking with her insurance company that they would  consider her operation and the recommendation would be to do an abdominal  myomectomy and at the same time, removal intrauterine polyps.  She returned  to the office on August 02, 2002,  for an ultrasound and a planned  sonohysterogram to compare with the study of August 31, 2001.  She still  had a fundal myoma that measured 5.6 x 6.2 x 8.8 cm and a fluid-filled  cavity, which was believed to be a polyp which measured 8 x 17 mm.  It was  well delineated by the fluid in the uterine cavity, so it was decided not to  proceed with a sonohysterogram.  Her right and left ovary were normal.  She  continued on her iron supplement at b.i.d. due to her severe anemia and her  hemoglobin was 11.4.  So she is now being scheduled for an abdominal  myomectomy with removal of intrauterine polyps on Tuesday, August 20, 2002.  She was too late to make arrangements for autologous blood donation.   PAST MEDICAL HISTORY:  History of four pregnancies and four miscarriages.   ALLERGIES:  She denies any allergies.   SOCIAL HISTORY:  Denies alcohol use or cigarette smoking.   MEDICATIONS:  She had been taking Ambien 10 mg p.o. q.h.s. and Zoloft 50 mg  for depression and anxiety.  She had been on Megace 20 mg t.i.d. and also on  iron supplementation.   PHYSICAL EXAMINATION:  WEIGHT:  The patient weighs 127 pounds.  HEIGHT:  5 feet 1-1/4 inches tall.  VITAL SIGNS:  Blood pressure 110/80.  HEENT:  Unremarkable.  NECK:  Supple.  Trachea midline.  No carotid bruits.  No thyromegaly.   LUNGS:  Clear to auscultation without rhonchi or wheezes.  HEART:  Regular rate and rhythm.  No murmurs or gallops.  BREASTS:  Normal at the time of her annual exam in August of this year.  ABDOMEN:  Soft and nontender without rebound or guarding.  PELVIC:  Bartholin, urethral, and Skene glands within normal limits.  Vagina  and cervix with no lesions or discharge.  Uterine irregular shape and size,  approximately 10-week size.  Adnexa difficult to evaluate.  RECTAL:  Unremarkable.   ASSESSMENT:  A 35 year old, gravida 4, para 0, AB 4, with menometrorrhagia,  leiomyomatous uteri, anemia, and also intrauterine polyps.  The patient is  scheduled for Tuesday, August 20, 2002, to undergo an abdominal  myomectomy and at the same time entering the intrauterine and removing the  polyps from an abdominal approach.  She is aware that her pregnancies in the  future will need to be delivered via cesarean section.  The risks, benefits,  pros, and cons of the operation to include infection, bleeding, and trauma  to internal organs, were discussed.  She will receive prophylactic  antibiotics.  In the event of uncontrollable hemorrhage in which the patient  will need blood or blood products, she is fully aware of risks, such  anaphylactic reaction from donor blood, as well as hepatitis and AIDS.  In  the event of lifesaving measure that the bleeding may not be contained that  as an emergency measure she could potentially lose her reproductive organs  if we were to proceed with an emergency hysterectomy.  Also, risks for deep  venous thrombosis and pulmonary embolism were discussed and trauma to  internal organs needing corrective surgery as well.  All of these organs  were discussed with the patient and were explained to her in Spanish.  We  will follow accordingly.   PLAN:  The patient is scheduled for abdominal myomectomy/intrauterine polypectomy on Tuesday, August 20, 2002, at 7:30 a.m. at the  [facility].  Juan H. Lily Peer, M.D.    JHF/MEDQ  D:  08/19/2002  T:  08/19/2002  Job:  [id number]

## 2011-06-24 ENCOUNTER — Encounter: Payer: Self-pay | Admitting: Gynecology

## 2011-06-24 ENCOUNTER — Encounter: Payer: Self-pay | Admitting: Anesthesiology

## 2011-06-24 ENCOUNTER — Ambulatory Visit (INDEPENDENT_AMBULATORY_CARE_PROVIDER_SITE_OTHER): Payer: Self-pay | Admitting: Gynecology

## 2011-06-24 VITALS — BP 116/74

## 2011-06-24 DIAGNOSIS — B373 Candidiasis of vulva and vagina: Secondary | ICD-10-CM

## 2011-06-24 DIAGNOSIS — N898 Other specified noninflammatory disorders of vagina: Secondary | ICD-10-CM

## 2011-06-24 DIAGNOSIS — Z113 Encounter for screening for infections with a predominantly sexual mode of transmission: Secondary | ICD-10-CM

## 2011-06-24 MED ORDER — CLINDAMYCIN PHOSPHATE 2 % VA CREA: 1.0000 | TOPICAL_CREAM | Freq: Every day | VAGINAL | Status: AC

## 2011-06-24 MED ORDER — FLUCONAZOLE 150 MG PO TABS: 150.0000 mg | ORAL_TABLET | Freq: Once | ORAL | Status: AC

## 2011-06-24 NOTE — Patient Instructions (Signed)
Rashell, I have called into prescription for you to your local pharmacy one is for the persistence of a yeast infection and the other one is for bacterial vaginosis. Both were common infections or not considered sexually transmitted. We did do cultures today and we'll have the results on Monday. If we do not call you by Monday the cultures are negative. Will otherwise see you back next year for your annual exam or sooner if needed. Please refrain from intercourse during this week of her treatments.

## 2011-06-24 NOTE — Progress Notes (Signed)
Patient presented to the office today complaining of a one-week history of vaginal pruritus. She purchase over-the-counter antifungal medication but applied it only topically. She is in a monogamous relationship. She is having normal menstrual cycles. Now using any form of contraception.  Pelvic exam: Bartholin urethra Skene glands slightly erythematous Vagina: Erythematous with white thick discharge Cervix: No gross lesions on inspection Bimanual examination: Unremarkable  Wet prep: Yeast and bacterial vaginosis  Assessment: Moniliasis and bacterial vaginosis will be treated as follows: Diflucan 150 mg one by mouth daily, start Cleocin vaginal cream intravaginally each bedtime for 5 days. She is to refrain from intercourse during this time. We will notify her if there is any abnormality to the [facility] and chlamydia culture that were obtained today.

## 2011-10-24 ENCOUNTER — Ambulatory Visit: Payer: Self-pay | Admitting: Gynecology

## 2011-10-28 ENCOUNTER — Other Ambulatory Visit: Payer: Self-pay

## 2011-10-28 LAB — ANTIBODY SCREEN: Antibody Screen: NEGATIVE

## 2011-10-28 LAB — RUBELLA ANTIBODY, IGM: Rubella: IMMUNE

## 2011-10-28 LAB — RPR: RPR: NONREACTIVE

## 2011-11-03 LAB — GC/CHLAMYDIA PROBE AMP, GENITAL: Chlamydia: NEGATIVE

## 2011-11-08 ENCOUNTER — Encounter (HOSPITAL_COMMUNITY): Payer: Self-pay | Admitting: *Deleted

## 2011-11-08 ENCOUNTER — Inpatient Hospital Stay (HOSPITAL_COMMUNITY): Payer: No Typology Code available for payment source

## 2011-11-08 ENCOUNTER — Inpatient Hospital Stay (HOSPITAL_COMMUNITY)
Admission: AD | Admit: 2011-11-08 | Discharge: 2011-11-08 | Disposition: A | Discharge status: Home / Self Care | Payer: No Typology Code available for payment source | Source: Ambulatory Visit | Attending: Obstetrics and Gynecology | Admitting: Obstetrics and Gynecology

## 2011-11-08 DIAGNOSIS — O209 Hemorrhage in early pregnancy, unspecified: Secondary | ICD-10-CM | POA: Insufficient documentation

## 2011-11-08 DIAGNOSIS — O4690 Antepartum hemorrhage, unspecified, unspecified trimester: Secondary | ICD-10-CM

## 2011-11-08 LAB — URINALYSIS, ROUTINE W REFLEX MICROSCOPIC
Bilirubin Urine: NEGATIVE
Glucose, UA: NEGATIVE mg/dL
Protein, ur: NEGATIVE mg/dL
Specific Gravity, Urine: 1.01 (ref 1.005–1.030)
Urobilinogen, UA: 0.2 mg/dL (ref 0.0–1.0)
pH: 6.5 (ref 5.0–8.0)

## 2011-11-08 LAB — CBC
Hemoglobin: 12.5 g/dL (ref 12.0–15.0)
MCHC: 34.5 g/dL (ref 30.0–36.0)
Platelets: 230 10*3/uL (ref 150–400)
RBC: 4.25 MIL/uL (ref 3.87–5.11)

## 2011-11-08 LAB — URINE MICROSCOPIC-ADD ON

## 2011-11-08 LAB — WET PREP, GENITAL
Trich, Wet Prep: NONE SEEN
WBC, Wet Prep HPF POC: NONE SEEN

## 2011-11-08 LAB — ABO/RH: ABO/RH(D): O POS

## 2011-11-08 NOTE — Progress Notes (Signed)
Pt started bleeding @ 1430 like a period.  LLQ & back pain since yesterday.

## 2011-11-08 NOTE — ED Provider Notes (Signed)
History   Pt presents today c/o vag bleeding that began around noon today. She states she has also had some abd cramping. She denies recent intercourse. She denies vag irritation, fever, or any other sx at this time.  Chief Complaint  Patient presents with  . Vaginal Bleeding   HPI  OB History    Grav Para Term Preterm Abortions TAB SAB Ect Mult Living   5 1   3  3   1       Past Medical History  Diagnosis Date  . 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 High risk HPV infection     Past Surgical History  Procedure Date  . Myomectomy abdominal approach   . Co2 laser of vulva and vagina 06/01/2005  . Cesarean section     Family History  Problem Relation Age of Onset  . Hypertension Mother     History  Substance Use Topics  . Smoking status: Former Games developer  . Smokeless tobacco: Never Used  . Alcohol Use: No    Allergies: No Known Allergies  No prescriptions prior to admission    Review of Systems  Constitutional: Negative for fever.  Cardiovascular: Negative for chest pain.  Gastrointestinal: Positive for abdominal pain. Negative for nausea, vomiting, diarrhea and constipation.  Genitourinary: Negative for dysuria, urgency, frequency and hematuria.  Neurological: Negative for dizziness and headaches.  Psychiatric/Behavioral: Negative for depression and suicidal ideas.   Physical Exam   Blood pressure 129/85, pulse 64, temperature 98.7 F (37.1 C), temperature source Oral, resp. rate 20, height 5' (1.524 m), weight 148 lb (67.132 kg), last menstrual period 06/29/2011.  Physical Exam  Nursing note and vitals reviewed. Constitutional: She is oriented to person, place, and time. She appears well-developed and well-nourished. No distress.  HENT:  Head: Normocephalic and atraumatic.  Eyes: EOM are normal. Pupils are equal, round, and reactive to light.  GI: Soft. She exhibits no distension. There is tenderness. There is no  rebound and no guarding.  Genitourinary: There is bleeding around the vagina. Vaginal discharge found.       Cervix Lg/closed. Small endocervical polyp noted. Moderate amount of dark blood noted within vag vault. No active bleeding noted. Digital exam not performed.  Neurological: She is alert and oriented to person, place, and time.  Skin: Skin is warm and dry. She is not diaphoretic.  Psychiatric: She has a normal mood and affect. Her behavior is normal. Judgment and thought content normal.    MAU Course  Procedures  Wet prep and GC/Chlamydia cultures done.  Assessment and Plan  Care of this pt turned over to Georges Mouse, CNM at 8pm.  Clinton Gallant. Rice III, DrHSc, MPAS, PA-C  11/08/2011, 5:11 PM   Henrietta Hoover, PA 11/08/11 2001  Results for orders placed during the hospital encounter of 11/08/11 (from the past 24 hour(s))  URINALYSIS, ROUTINE W REFLEX MICROSCOPIC     Status: Abnormal   Collection Time   11/08/11  4:40 PM      Component Value Range   Color, Urine YELLOW  YELLOW    APPearance CLEAR  CLEAR    Specific Gravity, Urine 1.010  1.005 - 1.030    pH 6.5  5.0 - 8.0    Glucose, UA NEGATIVE  NEGATIVE (mg/dL)   Hgb urine dipstick LARGE (*) NEGATIVE    Bilirubin Urine NEGATIVE  NEGATIVE    Ketones, ur NEGATIVE  NEGATIVE (mg/dL)   Protein, ur NEGATIVE  NEGATIVE (mg/dL)   Urobilinogen, UA 0.2  0.0 - 1.0 (mg/dL)   Nitrite NEGATIVE  NEGATIVE    Leukocytes, UA NEGATIVE  NEGATIVE   URINE MICROSCOPIC-ADD ON     Status: Abnormal   Collection Time   11/08/11  4:40 PM      Component Value Range   Squamous Epithelial / LPF FEW (*) RARE    WBC, UA 0-2  <3 (WBC/hpf)   RBC / HPF 0-2  <3 (RBC/hpf)  ABO/RH     Status: Normal   Collection Time   11/08/11  4:49 PM      Component Value Range   ABO/RH(D) O POS    CBC     Status: Abnormal   Collection Time   11/08/11  4:50 PM      Component Value Range   WBC 10.0  4.0 - 10.5 (K/uL)   RBC 4.25  3.87 - 5.11 (MIL/uL)    Hemoglobin 12.5  12.0 - 15.0 (g/dL)   HCT 30.8  65.7 - 84.6 (%)   MCV 85.2  78.0 - 100.0 (fL)   MCH 29.4  26.0 - 34.0 (pg)   MCHC 34.5  30.0 - 36.0 (g/dL)   RDW 96.2 (*) 95.2 - 15.5 (%)   Platelets 230  150 - 400 (K/uL)  WET PREP, GENITAL     Status: Normal   Collection Time   11/08/11  5:07 PM      Component Value Range   Yeast, Wet Prep NONE SEEN  NONE SEEN    Trich, Wet Prep NONE SEEN  NONE SEEN    Clue Cells, Wet Prep NONE SEEN  NONE SEEN    WBC, Wet Prep HPF POC NONE SEEN  NONE SEEN     Awaiting final u/s report from radiology - per preliminary report, + FHR, Placenta fundal, cervix 3.8 cm  Per patient no pain at this time, bleeding now decreased  A/P: 35 y.o. [id number] at [redacted]w[redacted]d Vaginal bleeding - stable F/U in office as scheduled, precautions rev'd

## 2011-11-09 ENCOUNTER — Encounter (HOSPITAL_COMMUNITY): Payer: Self-pay | Admitting: Advanced Practice Midwife

## 2011-11-09 LAB — GC/CHLAMYDIA PROBE AMP, GENITAL
Chlamydia, DNA Probe: NEGATIVE
GC Probe Amp, Genital: NEGATIVE

## 2011-11-22 NOTE — L&D Delivery Note (Signed)
Vaginal Delivery Note  Pt precipitously delivered fetus in bed.  Pt had been experiencing severe cramping and had increased bloody show.  On exam laminaria were palpated in the vagina.  One could be removed but other 2 were still in place.  Subsequently the fetus delivered in the bed.  Cord was clamped and cut by the RN and the fetus was transferred to the isolet and placed in a blanket and returned to the patient.  Placenta remained in situ.  When I arrived I performed a vaginal exam and the placenta was present in the vagina and removed. The patients bleeding was controlled and no lacerations required repair.  The fetus was inspected.  The cranium, face, and extremities appeared normal for a 21 week fetus.  The abdomen and torso were swollen appearing. Patient and the father of the baby decline genetic testing or autopsy.  EBL 200cc Mother stable and doing well after delivery

## 2011-11-28 ENCOUNTER — Other Ambulatory Visit: Payer: Self-pay

## 2011-12-04 ENCOUNTER — Other Ambulatory Visit: Payer: Self-pay

## 2011-12-07 ENCOUNTER — Ambulatory Visit (HOSPITAL_COMMUNITY)
Admission: RE | Admit: 2011-12-07 | Discharge: 2011-12-07 | Disposition: A | Discharge status: Home / Self Care | Payer: No Typology Code available for payment source | Source: Ambulatory Visit | Attending: Obstetrics and Gynecology | Admitting: Obstetrics and Gynecology

## 2011-12-07 ENCOUNTER — Other Ambulatory Visit (HOSPITAL_COMMUNITY): Payer: Self-pay | Admitting: Obstetrics and Gynecology

## 2011-12-07 DIAGNOSIS — IMO0002 Reserved for concepts with insufficient information to code with codable children: Secondary | ICD-10-CM

## 2011-12-07 DIAGNOSIS — O2690 Pregnancy related conditions, unspecified, unspecified trimester: Secondary | ICD-10-CM

## 2011-12-07 DIAGNOSIS — Z363 Encounter for antenatal screening for malformations: Secondary | ICD-10-CM | POA: Insufficient documentation

## 2011-12-07 DIAGNOSIS — O09529 Supervision of elderly multigravida, unspecified trimester: Secondary | ICD-10-CM | POA: Insufficient documentation

## 2011-12-07 DIAGNOSIS — O358XX0 Maternal care for other (suspected) fetal abnormality and damage, not applicable or unspecified: Secondary | ICD-10-CM | POA: Insufficient documentation

## 2011-12-07 DIAGNOSIS — Z1389 Encounter for screening for other disorder: Secondary | ICD-10-CM | POA: Insufficient documentation

## 2011-12-07 NOTE — Progress Notes (Signed)
Brittany Frost was seen for ultrasound appointment today.  Please see [facility] report for details.  

## 2011-12-12 NOTE — Progress Notes (Signed)
Encounter addended by: Marlana Latus, RN on: 12/12/2011  2:55 PM<BR>     Documentation filed: Episodes, Chief Complaint Section

## 2011-12-15 ENCOUNTER — Other Ambulatory Visit (HOSPITAL_COMMUNITY): Payer: Self-pay | Admitting: Maternal and Fetal Medicine

## 2011-12-15 ENCOUNTER — Ambulatory Visit (HOSPITAL_COMMUNITY)
Admission: RE | Admit: 2011-12-15 | Discharge: 2011-12-15 | Disposition: A | Discharge status: Home / Self Care | Payer: No Typology Code available for payment source | Source: Ambulatory Visit | Attending: Maternal and Fetal Medicine | Admitting: Maternal and Fetal Medicine

## 2011-12-15 ENCOUNTER — Ambulatory Visit (HOSPITAL_COMMUNITY)
Admission: RE | Admit: 2011-12-15 | Discharge: 2011-12-15 | Disposition: A | Discharge status: Home / Self Care | Payer: No Typology Code available for payment source | Source: Ambulatory Visit | Attending: Obstetrics and Gynecology | Admitting: Obstetrics and Gynecology

## 2011-12-15 ENCOUNTER — Encounter (HOSPITAL_COMMUNITY): Payer: Self-pay | Admitting: *Deleted

## 2011-12-15 ENCOUNTER — Inpatient Hospital Stay (HOSPITAL_COMMUNITY)
Admission: AD | Admit: 2011-12-15 | Discharge: 2011-12-16 | DRG: 779 | Disposition: A | Discharge status: Home / Self Care | Payer: Medicaid Other | Source: Ambulatory Visit | Attending: Obstetrics and Gynecology | Admitting: Obstetrics and Gynecology

## 2011-12-15 DIAGNOSIS — O09529 Supervision of elderly multigravida, unspecified trimester: Secondary | ICD-10-CM

## 2011-12-15 DIAGNOSIS — IMO0002 Reserved for concepts with insufficient information to code with codable children: Secondary | ICD-10-CM

## 2011-12-15 DIAGNOSIS — O365990 Maternal care for other known or suspected poor fetal growth, unspecified trimester, not applicable or unspecified: Secondary | ICD-10-CM | POA: Insufficient documentation

## 2011-12-15 DIAGNOSIS — O021 Missed abortion: Principal | ICD-10-CM | POA: Diagnosis present

## 2011-12-15 HISTORY — DX: Gestational (pregnancy-induced) hypertension without significant proteinuria, unspecified trimester: O13.9

## 2011-12-15 LAB — COMPREHENSIVE METABOLIC PANEL
ALT: 22 U/L (ref 0–35)
AST: 21 U/L (ref 0–37)
CO2: 22 mEq/L (ref 19–32)
Calcium: 9.5 mg/dL (ref 8.4–10.5)
Chloride: 103 mEq/L (ref 96–112)
Creatinine, Ser: 0.41 mg/dL — ABNORMAL LOW (ref 0.50–1.10)
GFR calc Af Amer: >90 mL/min (ref >90)
GFR calc non Af Amer: >90 mL/min (ref >90)
Glucose, Bld: 90 mg/dL (ref 70–99)
Total Bilirubin: 0.1 mg/dL — ABNORMAL LOW (ref 0.3–1.2)

## 2011-12-15 LAB — CBC
Hemoglobin: 12.9 g/dL (ref 12.0–15.0)
MCH: 30.4 pg (ref 26.0–34.0)
MCV: 89.9 fL (ref 78.0–100.0)
Platelets: 228 10*3/uL (ref 150–400)
RBC: 4.24 MIL/uL (ref 3.87–5.11)
WBC: 10.9 10*3/uL — ABNORMAL HIGH (ref 4.0–10.5)

## 2011-12-15 LAB — TYPE AND SCREEN: ABO/RH(D): O POS

## 2011-12-15 LAB — PROTIME-INR: INR: 1.04 (ref 0.00–1.49)

## 2011-12-15 LAB — STREP B DNA PROBE

## 2011-12-15 LAB — FIBRINOGEN: Fibrinogen: 401 mg/dL (ref 204–475)

## 2011-12-15 LAB — APTT: aPTT: 27 seconds (ref 24–37)

## 2011-12-15 LAB — LACTATE DEHYDROGENASE: LDH: 185 U/L (ref 94–250)

## 2011-12-15 MED ORDER — CITRIC ACID-SODIUM CITRATE 334-500 MG/5ML PO SOLN: 30.0000 mL | ORAL | Status: DC | PRN

## 2011-12-15 MED ORDER — OXYTOCIN BOLUS FROM INFUSION
500.0000 mL | Freq: Once | INTRAVENOUS | Status: DC
  Filled 2011-12-15: qty 1000
  Filled 2011-12-15: qty 500

## 2011-12-15 MED ORDER — LIDOCAINE HCL (PF) 1 % IJ SOLN
30.0000 mL | INTRAMUSCULAR | Status: DC | PRN
  Filled 2011-12-15: qty 30

## 2011-12-15 MED ORDER — FLEET ENEMA 7-19 GM/118ML RE ENEM: 1.0000 | ENEMA | RECTAL | Status: DC | PRN

## 2011-12-15 MED ORDER — MISOPROSTOL 200 MCG PO TABS
ORAL_TABLET | ORAL | Status: AC
  Filled 2011-12-15: qty 1

## 2011-12-15 MED ORDER — ACETAMINOPHEN 325 MG PO TABS: 650.0000 mg | ORAL_TABLET | ORAL | Status: DC | PRN

## 2011-12-15 MED ORDER — MISOPROSTOL 100 MCG PO TABS
100.0000 ug | ORAL_TABLET | ORAL | Status: DC
  Administered 2011-12-15 – 2011-12-16 (×2): 100 ug via VAGINAL
  Filled 2011-12-15 (×5): qty 1

## 2011-12-15 MED ORDER — ONDANSETRON HCL 4 MG/2ML IJ SOLN: 4.0000 mg | Freq: Four times a day (QID) | INTRAMUSCULAR | Status: DC | PRN

## 2011-12-15 MED ORDER — LABETALOL HCL 5 MG/ML IV SOLN
10.0000 mg | INTRAVENOUS | Status: DC | PRN
  Filled 2011-12-15: qty 4

## 2011-12-15 MED ORDER — LACTATED RINGERS IV SOLN
INTRAVENOUS | Status: DC
  Administered 2011-12-15 (×2): via INTRAVENOUS

## 2011-12-15 MED ORDER — OXYCODONE-ACETAMINOPHEN 5-325 MG PO TABS: 2.0000 | ORAL_TABLET | ORAL | Status: DC | PRN

## 2011-12-15 MED ORDER — MISOPROSTOL 200 MCG PO TABS
ORAL_TABLET | ORAL | Status: AC
  Administered 2011-12-15: 200 ug via VAGINAL
  Filled 2011-12-15: qty 1

## 2011-12-15 MED ORDER — BUTORPHANOL TARTRATE 2 MG/ML IJ SOLN
1.0000 mg | INTRAMUSCULAR | Status: DC | PRN
  Administered 2011-12-16 (×2): 1 mg via INTRAVENOUS
  Filled 2011-12-15 (×3): qty 1

## 2011-12-15 MED ORDER — IBUPROFEN 600 MG PO TABS: 600.0000 mg | ORAL_TABLET | Freq: Four times a day (QID) | ORAL | Status: DC | PRN

## 2011-12-15 MED ORDER — ZOLPIDEM TARTRATE 10 MG PO TABS
10.0000 mg | ORAL_TABLET | Freq: Every evening | ORAL | Status: DC | PRN
  Administered 2011-12-15: 10 mg via ORAL
  Filled 2011-12-15: qty 1

## 2011-12-15 MED ORDER — TERBUTALINE SULFATE 1 MG/ML IJ SOLN: 0.2500 mg | Freq: Once | INTRAMUSCULAR | Status: AC | PRN

## 2011-12-15 MED ORDER — LACTATED RINGERS IV SOLN: 500.0000 mL | INTRAVENOUS | Status: DC | PRN

## 2011-12-15 MED ORDER — OXYTOCIN 20 UNITS IN LACTATED RINGERS INFUSION - SIMPLE
125.0000 mL/h | Freq: Once | INTRAVENOUS | Status: AC
  Administered 2011-12-16: 999 mL/h via INTRAVENOUS

## 2011-12-15 NOTE — Progress Notes (Signed)
OB ultrasound performed today.  Fetal demise diagnosed.  Patient sent to L&D following her visit.  Please see full report in [facility].

## 2011-12-15 NOTE — Progress Notes (Signed)
Encounter addended by: Quinn Plowman on: 12/15/2011  1:02 PM<BR>     Documentation filed: Chief Complaint Section, Notes Section

## 2011-12-15 NOTE — Progress Notes (Signed)
Genetic Counseling  High-Risk Gestation Note  Appointment Date:  12/07/11 Referred By: Brittany Aspen, DO Date of Birth:  Aug 20, 1976 Partner:  Brittany Frost    Pregnancy History: [id number] Estimated Date of Delivery: 04/21/12 Estimated Gestational Age: 21w5 Attending: Rema Fendt, MD   Brittany Frost and her husband, Brittany Frost, were seen for genetic counseling because of previous ultrasound findings. The patient is 36 y.o. Additionally, Brittany Frost maternal serum Quad screen result increased the risk for fetal Down syndrome (1 in 62) and the risk for open neural tube defects (1 in 22) given an elevated AFP of 8.13 MoM.  Ultrasound performed today confirmed the findings of severe intrauterine growth restriction and enlarged placenta. Complete ultrasound results are reported separately. Given the ultrasound findings, the very poor prognosis for the pregnancy was discussed with the couple.    They were counseled regarding maternal age and the association with risk for chromosome conditions due to nondisjunction with aging of the ova.   We reviewed chromosomes, nondisjunction, and the associated risk for fetal aneuploidy related to a maternal age of 36 y.o..  We discussed the increased risk for spontaneous miscarriage in pregnancies with an underlying chromosome condition. We specifically discussed Down syndrome (trisomy 21), trisomies 13 and 84, including the common features and prognoses of each. We specifically discussed that the ultrasound findings in the current pregnancy may be consistent with triploidy.   We discussed that triploidy occurs when a whole extra set of chromosomes is present in a pregnancy, meaning that 47 chromosomes are present in every cell instead of 46.  Triploidy results when either: (1) the sperm contributes twice as many chromosomes as expected due to a mistake in cell division that create the sperm (diandry), (2) two sperm simultaneously fertilize an egg  (diandry), or (3) the egg contributes twice as many chromosomes as expected due to a mistake in cell division (digyny).  Approximately 99.9% of triploid pregnancies are lost as an early first trimester miscarriage or pass away in utero in the second trimester.  Very few survive to the time of prenatal diagnosis (amniocentesis).  However, it seems that a difference in fetal viability exists depending on whether the extra chromosomes come from the egg or the sperm.  Most diandric triploids (from the sperm), miscarry around [redacted] weeks gestation.  Most dygynic triploid pregnancies (from the egg) miscarry early, although some can survive into the third trimester but have significant problems with growth and development.  Of the very rare instances when triploid pregnancies survive to term, virtually all pass away within a few days time.  They were counseled regarding the option of amniocentesis.  The risks, benefits, and limitations of were reviewed in detail.  After thoughtful consideration of these options, they elected to proceed with ultrasound only, but declined amniocentesis at the time of their visit today.  They planned to return in one week for fetal reassessment via ultrasound and possible amniocentesis.  We also discussed that in the case of a demise, chromosome analysis may be available on products of conception. We discussed that we are unable to assess recurrence risk for future pregnancies in the absence of an identified underlying cause.   Both family histories were reviewed and found to be noncontributory for birth defects, mental retardation, and known genetic conditions. Without further information regarding the provided family history, an accurate genetic risk cannot be calculated. Further genetic counseling is warranted if more information is obtained.  Brittany Frost denied exposure to environmental toxins or  chemical agents. She denied the use of alcohol, tobacco or street drugs. She denied  significant viral illnesses during the course of her pregnancy. Her medical and surgical histories were noncontributory.   I counseled this couple regarding the above risks and available options.  The approximate face-to-face time with the genetic counselor was 45 minutes.   12/14/2010 Addendum: Ms. Brittany Frost returned for follow-up ultrasound today, and a fetal demise was noted at the time of today's visit. I met with the couple and the patient's sister and provided them with support resources including Empty Arms by Peri Maris (written in Spanish) as well as information regarding [facility]regnancy [facility] Infant Loss support group. Please see separate ultrasound report for detailed information from today's visit.    Brittany Plowman, MS,  Certified Genetic Counselor  12/15/2011

## 2011-12-15 NOTE — Progress Notes (Signed)
Encounter addended by: Quinn Plowman on: 12/15/2011  3:59 PM<BR>     Documentation filed: Notes Section, Letters

## 2011-12-15 NOTE — H&P (Signed)
Brittany Frost is a 36 y.o. female at 21+5 weeks presenting for Induction for IUFD  Pt has been followed jointly in our office and MFM for elevated DSR of 1 in 62.  She was seen in MFM today for a follow up ultrasound and diagnosed with an IUFD.  Pt presented to our office late in gestation for prenatal care at 19 weeks. A quad screen returned an increased risk of Downs Syndrome at 1 in 52 and increased risk for ONTD at 1 in 65.  Initial anatomy ultrasound at MFM  showed IUGR and an enlarged placenta with reversed UA end diastolic flow.  Fetus was suspected to have triploidy. Pt was advised of moribund findings but declined amnio of genetic screening. Follow-up u/s today confirmed fetal demise.    Management and plan for induction of labor was discussed with the patient and she wishes to proceed.  She understands the prolonged nature of labor induction.  At this time she declines autopsy or genetic testing of the fetus.   BPs on admission were elevated.  Pre-eclampsia labwork was normal.  Patient may require antihypertensive medications.  History OB History    Grav Para Term Preterm Abortions TAB SAB Ect Mult Living   5 1   3  3   1      Past Medical History  Diagnosis Date  . 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 High risk HPV infection   . Pregnancy induced hypertension    Past Surgical History  Procedure Date  . Myomectomy abdominal approach   . Co2 laser of vulva and vagina 06/01/2005  . Cesarean section    Family History: family history includes Hypertension in her mother. Social History:  reports that she has quit smoking. She has never used smokeless tobacco. She reports that she does not drink alcohol or use illicit drugs.  ROS    Blood pressure 150/94, pulse 85, temperature 99.3 F (37.4 C), temperature source Oral, resp. rate 18, height 5' (1.524 m), weight 68.947 kg (152 lb), last menstrual period 06/29/2011. Exam Physical Exam    Prenatal labs: ABO, Rh: --/--/O POS (01/24 1734) Antibody: NEG (01/24 1734) Rubella: Immune (12/07 0000) RPR: Nonreactive (12/07 0000)  HBsAg: Negative (12/07 0000)  HIV: Non-reactive (12/07 0000)  GBS: Unknown (01/24 1831)   Assessment/Plan: 36 yo [id number] @ 21+5 for IOL for IUFD 1) Admit 2) Laminaria placed transcervically using sterile technique.  3 medium (4mm) laminaria were placed and a betadine soaked raytech was then placed in the vagina. 3) misoprostal were placed vaginally.  Plan to continue misoprostal Q4hrs.  May need to increase to 200 or 400 if minimal results after 12 hours. 4) Epidural on request 5) Pre-Eclampsia labs WNL, antihypertensive medications prn.    Brittany Torrens H. 12/15/2011, 7:28 PM

## 2011-12-16 ENCOUNTER — Other Ambulatory Visit: Payer: Self-pay | Admitting: Obstetrics and Gynecology

## 2011-12-16 LAB — CBC
MCH: 30.2 pg (ref 26.0–34.0)
MCHC: 33.9 g/dL (ref 30.0–36.0)
MCV: 89.1 fL (ref 78.0–100.0)
Platelets: 202 10*3/uL (ref 150–400)
RDW: 15.5 % (ref 11.5–15.5)
WBC: 15.2 10*3/uL — ABNORMAL HIGH (ref 4.0–10.5)

## 2011-12-16 LAB — RPR: RPR Ser Ql: NONREACTIVE

## 2011-12-16 MED ORDER — IBUPROFEN 600 MG PO TABS
600.0000 mg | ORAL_TABLET | Freq: Four times a day (QID) | ORAL | Status: DC
  Administered 2011-12-16: 600 mg via ORAL
  Filled 2011-12-16: qty 1

## 2011-12-16 MED ORDER — DIBUCAINE 1 % RE OINT: 1.0000 "application " | TOPICAL_OINTMENT | RECTAL | Status: DC | PRN

## 2011-12-16 MED ORDER — IBUPROFEN 600 MG PO TABS: 600.0000 mg | ORAL_TABLET | Freq: Four times a day (QID) | ORAL | Status: AC

## 2011-12-16 MED ORDER — WITCH HAZEL-GLYCERIN EX PADS: 1.0000 "application " | MEDICATED_PAD | CUTANEOUS | Status: DC | PRN

## 2011-12-16 MED ORDER — ONDANSETRON HCL 4 MG PO TABS: 4.0000 mg | ORAL_TABLET | ORAL | Status: DC | PRN

## 2011-12-16 MED ORDER — ZOLPIDEM TARTRATE 5 MG PO TABS: 5.0000 mg | ORAL_TABLET | Freq: Every evening | ORAL | Status: DC | PRN

## 2011-12-16 MED ORDER — BENZOCAINE-MENTHOL 20-0.5 % EX AERO: 1.0000 "application " | INHALATION_SPRAY | CUTANEOUS | Status: DC | PRN

## 2011-12-16 MED ORDER — TETANUS-DIPHTH-ACELL PERTUSSIS 5-2.5-18.5 LF-MCG/0.5 IM SUSP
0.5000 mL | Freq: Once | INTRAMUSCULAR | Status: DC
  Filled 2011-12-16: qty 0.5

## 2011-12-16 MED ORDER — OXYCODONE-ACETAMINOPHEN 5-325 MG PO TABS: 1.0000 | ORAL_TABLET | ORAL | Status: DC | PRN

## 2011-12-16 MED ORDER — SENNOSIDES-DOCUSATE SODIUM 8.6-50 MG PO TABS: 2.0000 | ORAL_TABLET | Freq: Every day | ORAL | Status: DC

## 2011-12-16 MED ORDER — DIPHENHYDRAMINE HCL 25 MG PO CAPS: 25.0000 mg | ORAL_CAPSULE | Freq: Four times a day (QID) | ORAL | Status: DC | PRN

## 2011-12-16 MED ORDER — ONDANSETRON HCL 4 MG/2ML IJ SOLN: 4.0000 mg | INTRAMUSCULAR | Status: DC | PRN

## 2011-12-16 MED ORDER — SIMETHICONE 80 MG PO CHEW: 80.0000 mg | CHEWABLE_TABLET | ORAL | Status: DC | PRN

## 2011-12-16 NOTE — Progress Notes (Signed)
UR Chart review completed.  

## 2011-12-16 NOTE — Progress Notes (Signed)
Chaplain Note: Paged at 4:52AM.  Arrived Hospital at 5:35AM for infant demise.  Patient (mother) and husband [name] requested baptism.  Baptized baby.  Spent time with parents listening to their story.  Provided some guidance as to arrangements.  Couple doing very well. They were waiting for their 36 year old son to arrive to be with them and se his sister.  Will refer to patient to day chaplain for follow up.  Patient to be transferred as of this writing to Rm. 310.  Rema Jasmine, Chaplain Local Page number: [phone]

## 2011-12-16 NOTE — Progress Notes (Signed)
Pt. Transferred to [facility] Unit via wheelchair with significant other.

## 2011-12-16 NOTE — Discharge Summary (Signed)
Obstetric Discharge Summary Reason for Admission: IUFD Prenatal Procedures: ultrasound Intrapartum Procedures: spontaneous vaginal delivery Postpartum Procedures: none Complications-Operative and Postpartum: none Hemoglobin  Date Value Range Status  12/16/2011 11.9* 12.0-15.0 (g/dL) Final     HCT  Date Value Range Status  12/16/2011 35.1* 36.0-46.0 (%) Final    Discharge Diagnoses: vaginal delivery of 2nd trimester IUFD  Discharge Information: Date: 12/16/2011 Activity: unrestricted Diet: routine Medications: PNV and Ibuprofen Condition: stable Instructions: refer to practice specific booklet Discharge to: home Follow-up Information    Follow up with Brittany Rahilly, DO in 4 weeks. (please call office for appt. next week to check your blood pressure.)    Contact information:   [address], [address] [facility] [place] [address] [phone]          Newborn Data: deceased  female  Birth Weight:  APGAR: 0, 0    [name] 12/16/2011, 2:24 PM

## 2011-12-16 NOTE — Progress Notes (Signed)
Followed up with pt and family after initial chaplain visit this morning.  Offered grief support to mother and father of baby as well as their 36 year old son.    Thornell Sartorius Chaplain Pager, [phone] 3:19 PM   12/16/11 1500  Clinical Encounter Type  Visited With Patient and family together  Visit Type Follow-up  Referral From Chaplain  Spiritual Encounters  Spiritual Needs Grief support  Stress Factors  Patient Stress Factors Loss

## 2011-12-19 ENCOUNTER — Encounter (HOSPITAL_COMMUNITY): Payer: Self-pay | Admitting: *Deleted

## 2011-12-20 ENCOUNTER — Encounter (HOSPITAL_COMMUNITY): Payer: Self-pay | Admitting: *Deleted

## 2011-12-23 DEATH — deceased

## 2013-06-28 IMAGING — US US OB LIMITED
1 series · 13 of 13 positions shown · non-contrast
Comparison: none

[Series 1: us ob limited · 0.23mm/px · 13 acquisitions, 13 frames shown]
[im 1/13]
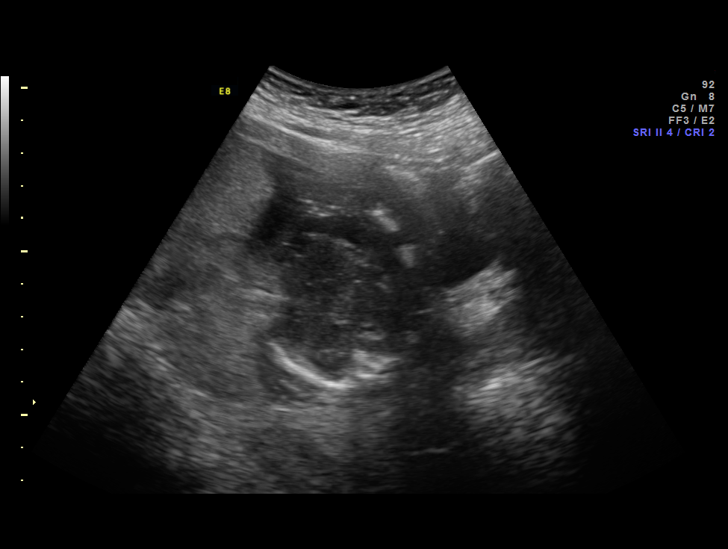
[im 2/13]
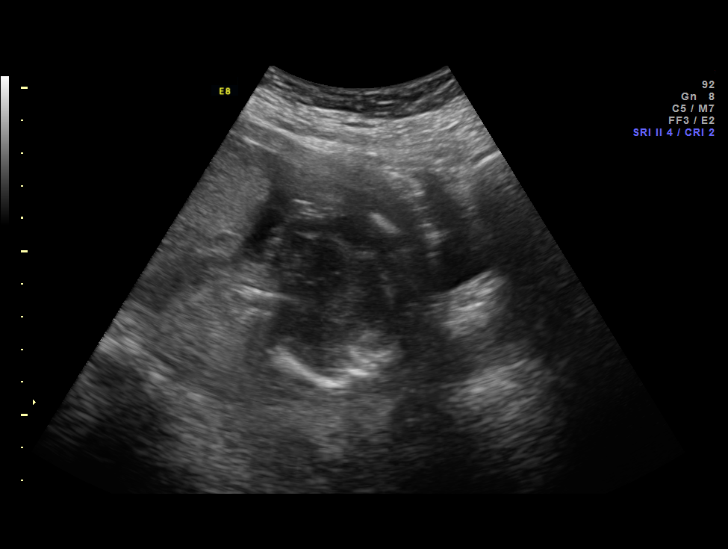
[im 3/13]
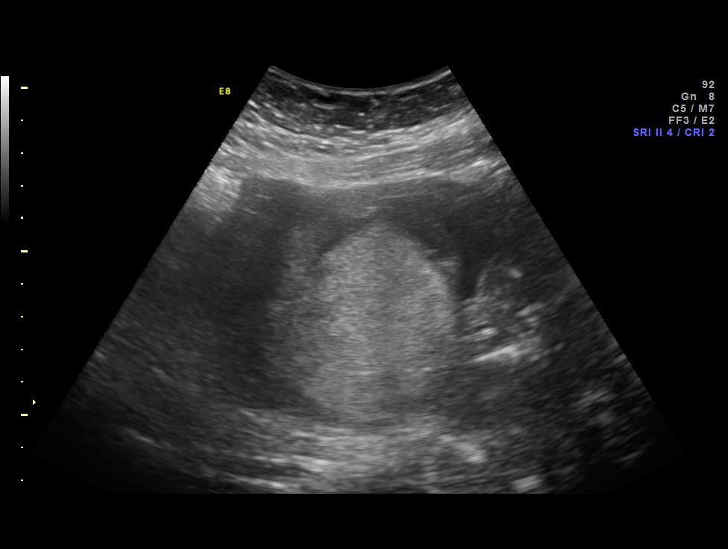
[im 4/13]
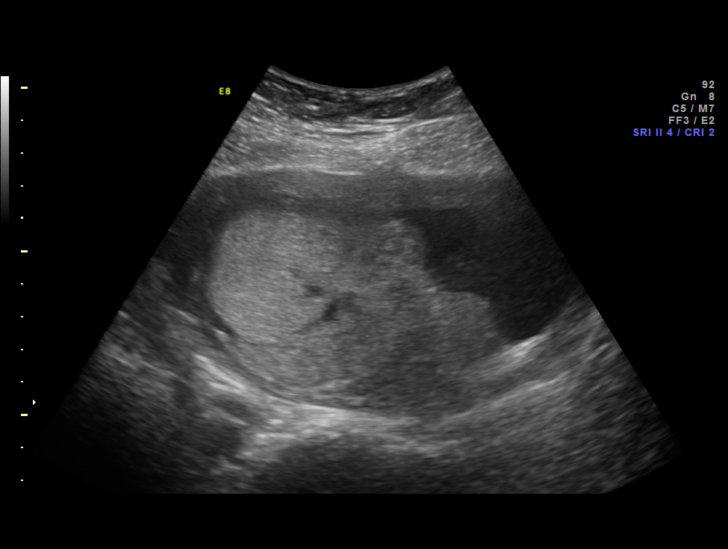
[im 5/13]
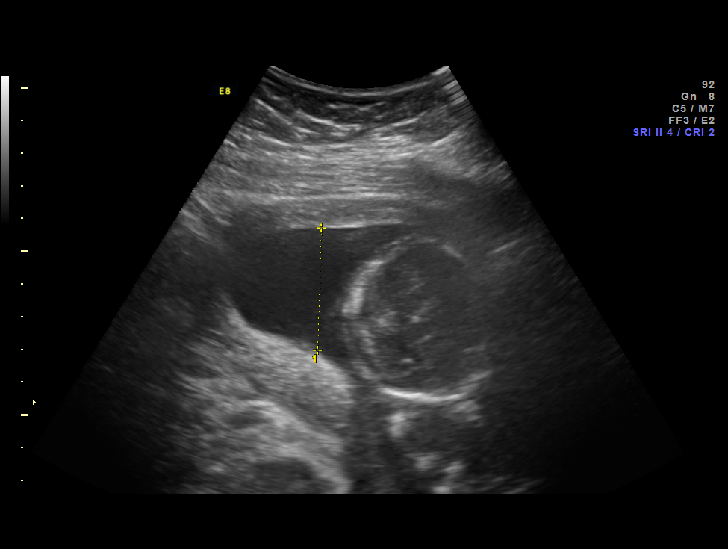
[im 6/13]
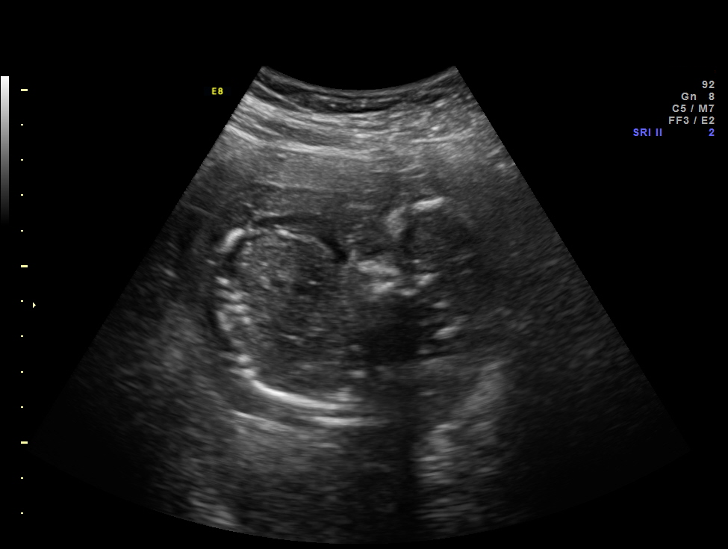
[im 7/13]
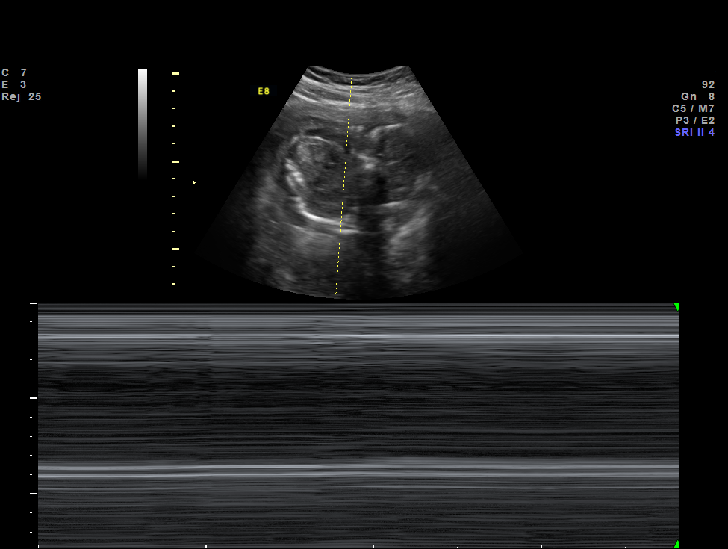
[im 8/13]
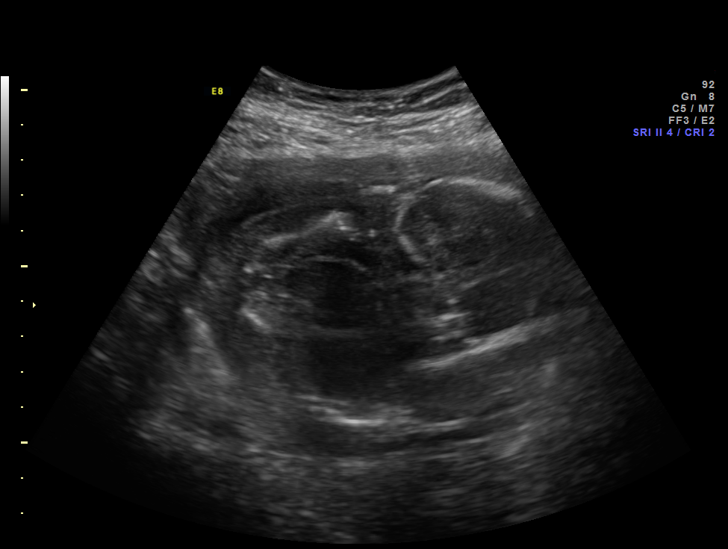
[im 9/13]
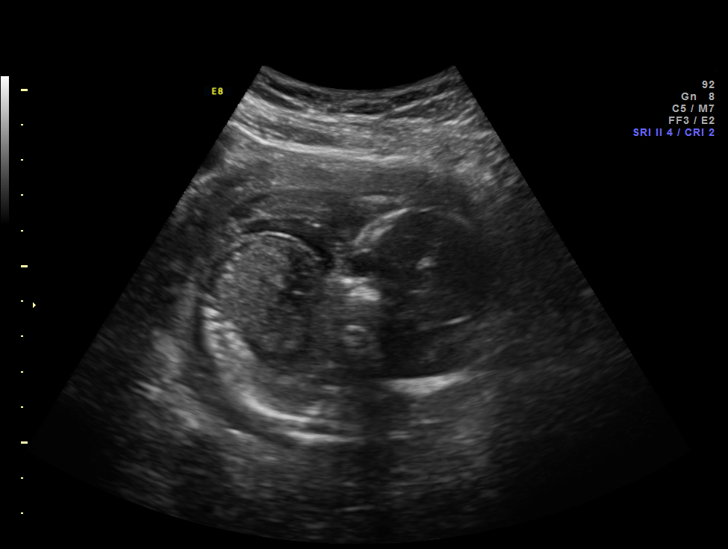
[im 10/13]
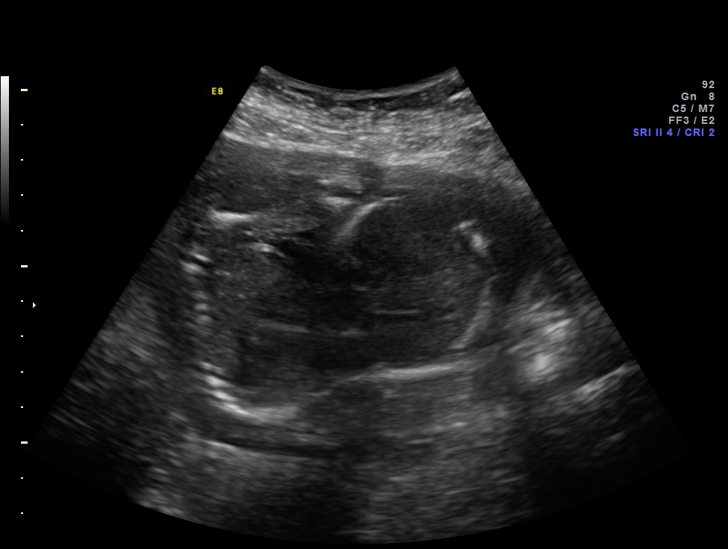
[im 11/13]
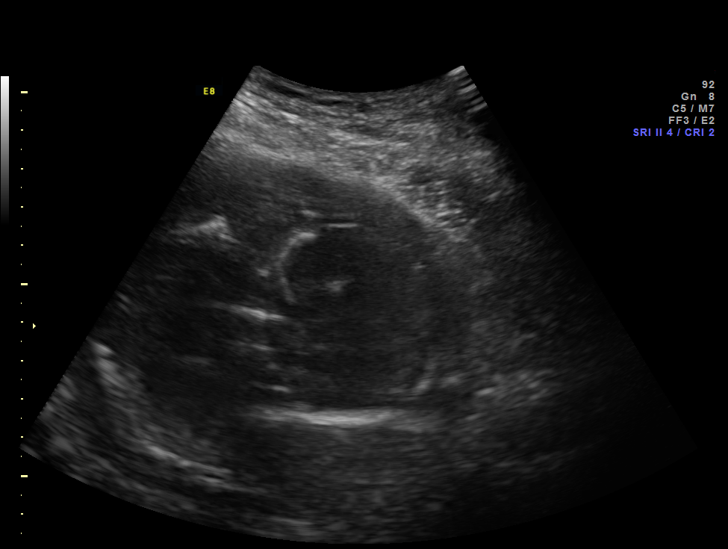
[im 12/13]
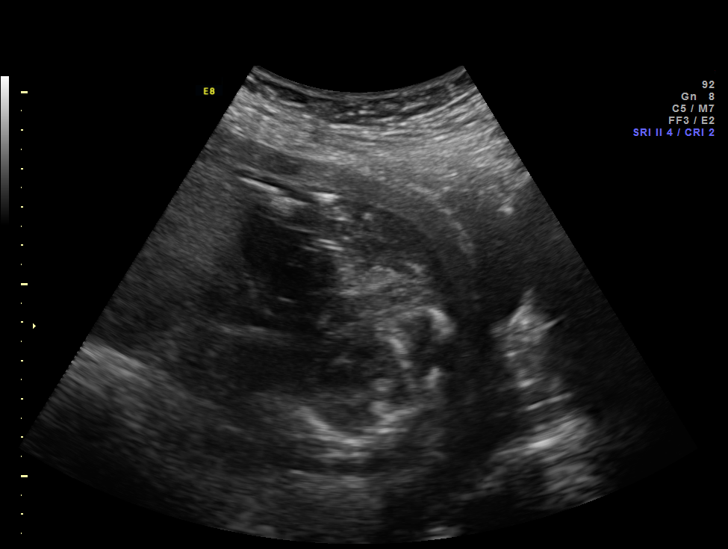
[im 13/13]
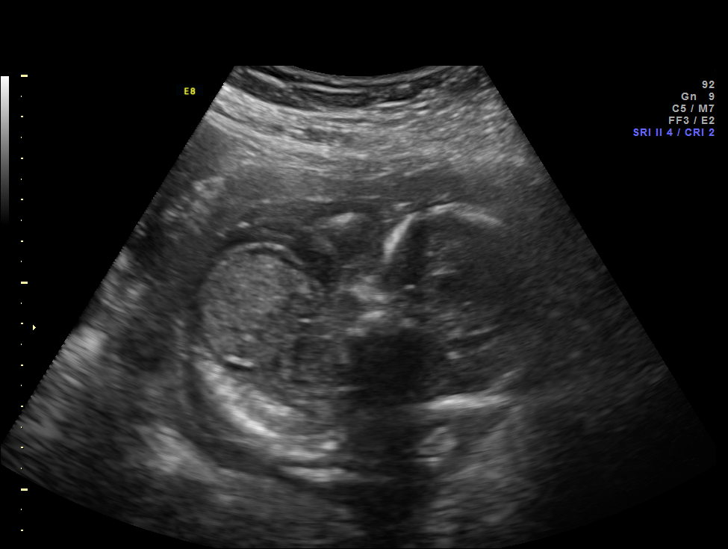

[13 of 13 positions shown; findings below may reference images not displayed]

Canned report from images found in remote index.

Refer to host system for actual result text.

## 2014-09-22 ENCOUNTER — Encounter (HOSPITAL_COMMUNITY): Payer: Self-pay | Admitting: *Deleted

## 2015-01-15 ENCOUNTER — Encounter: Payer: Self-pay | Admitting: Gynecology

## 2015-01-15 ENCOUNTER — Other Ambulatory Visit (HOSPITAL_COMMUNITY)
Admission: RE | Admit: 2015-01-15 | Discharge: 2015-01-15 | Disposition: A | Discharge status: Home / Self Care | Payer: No Typology Code available for payment source | Source: Ambulatory Visit | Attending: Gynecology | Admitting: Gynecology

## 2015-01-15 ENCOUNTER — Ambulatory Visit (INDEPENDENT_AMBULATORY_CARE_PROVIDER_SITE_OTHER): Payer: Self-pay | Admitting: Gynecology

## 2015-01-15 VITALS — BP 118/70 | Ht 60.5 in | Wt 145.0 lb

## 2015-01-15 DIAGNOSIS — Z01419 Encounter for gynecological examination (general) (routine) without abnormal findings: Secondary | ICD-10-CM

## 2015-01-15 DIAGNOSIS — Z8632 Personal history of gestational diabetes: Secondary | ICD-10-CM

## 2015-01-15 DIAGNOSIS — N96 Recurrent pregnancy loss: Secondary | ICD-10-CM | POA: Insufficient documentation

## 2015-01-15 DIAGNOSIS — Z1151 Encounter for screening for human papillomavirus (HPV): Secondary | ICD-10-CM | POA: Insufficient documentation

## 2015-01-15 LAB — COMPREHENSIVE METABOLIC PANEL
ALBUMIN: 4.1 g/dL (ref 3.5–5.2)
ALK PHOS: 69 U/L (ref 39–117)
ALT: 8 U/L (ref 0–35)
AST: 11 U/L (ref 0–37)
BUN: 11 mg/dL (ref 6–23)
CALCIUM: 8.6 mg/dL (ref 8.4–10.5)
CHLORIDE: 105 meq/L (ref 96–112)
CO2: 23 mEq/L (ref 19–32)
CREATININE: 0.62 mg/dL (ref 0.50–1.10)
Glucose, Bld: 96 mg/dL (ref 70–99)
POTASSIUM: 4.2 meq/L (ref 3.5–5.3)
Sodium: 136 mEq/L (ref 135–145)
Total Bilirubin: 0.2 mg/dL (ref 0.2–1.2)
Total Protein: 7.3 g/dL (ref 6.0–8.3)

## 2015-01-15 LAB — CBC WITH DIFFERENTIAL/PLATELET
BASOS PCT: 0 % (ref 0–1)
Basophils Absolute: 0 10*3/uL (ref 0.0–0.1)
EOS ABS: 0.1 10*3/uL (ref 0.0–0.7)
Eosinophils Relative: 2 % (ref 0–5)
HCT: 32.3 % — ABNORMAL LOW (ref 36.0–46.0)
Hemoglobin: 10 g/dL — ABNORMAL LOW (ref 12.0–15.0)
Lymphocytes Relative: 34 % (ref 12–46)
Lymphs Abs: 2.4 10*3/uL (ref 0.7–4.0)
MCH: 23.1 pg — ABNORMAL LOW (ref 26.0–34.0)
MCHC: 31 g/dL (ref 30.0–36.0)
MCV: 74.6 fL — ABNORMAL LOW (ref 78.0–100.0)
MONO ABS: 0.8 10*3/uL (ref 0.1–1.0)
MONOS PCT: 11 % (ref 3–12)
MPV: 9 fL (ref 8.6–12.4)
NEUTROS ABS: 3.8 10*3/uL (ref 1.7–7.7)
NEUTROS PCT: 53 % (ref 43–77)
PLATELETS: 357 10*3/uL (ref 150–400)
RBC: 4.33 MIL/uL (ref 3.87–5.11)
RDW: 17 % — AB (ref 11.5–15.5)
WBC: 7.2 10*3/uL (ref 4.0–10.5)

## 2015-01-15 LAB — CHOLESTEROL, TOTAL: Cholesterol: 139 mg/dL (ref 0–200)

## 2015-01-15 LAB — TSH: TSH: 1.07 u[IU]/mL (ref 0.350–4.500)

## 2015-01-15 NOTE — Patient Instructions (Signed)
Ecografa transvaginal (Transvaginal Ultrasound) La ecografa transvaginal es una ecografa plvica en la que se utiliza una probeta metlica que se coloca en la vagina, para observar los rganos femeninos. El ecgrafo enva ondas sonoras desde un transductor (sonda). Estas ondas sonoras chocan contra las estructuras del cuerpo (como un eco) y crean Proofreader. La imagen se observa en un monitor. Se denomina transvaginal debido a que la sonda se inserta dentro de la vagina. Puede haber una pequea molestia por la introduccin de la sonda. Esta prueba tambin puede realizarse [employer]. La ecografa endovaginal es otro nombre para la ecografa transvaginal. En una ecografa transabdominal, la sonda se coloca en la parte externa del abdomen. Este mtodo no ofrece imgenes tan buenas como la tcnica transvaginal. La ecogafa transvaginal se utiliza para observar alteraciones en el tracto genital femenino. Entre ellos se incluyen:  Problemas de infertilidad.  Malformaciones congnitas (defecto de nacimiento) del tero y los ovarios.  Tumores en el tero.  Hemorragias anormales.  Tumores y quistes de ovario.  Abscesos (tejidos inflamados y pus) en la pelvis.  Dolor abdominal o plvico sin causa aparente.  Infecciones plvicas. DURANTE EL EMBARAZO, SE UTILIZA PAR OBSERVAR:  Embarazos normales.  Un embarazo ectpico (embarazo fuera del tero).  Latidos cardacos fetales.  Anormalidades de la pelvis que no se observan bien con la ecografa transabdominal.  Sospecha de gemelos o embarazo mltiple.  Aborto inminente.  Problemas en el cuello del tero (cuello incompetente, no permanece cerrado para contener al beb).  Cuando se realiza una amniocentesis (se retira lquido de la bolsa [place], para ser [place]).  Al buscar anormalidades en el beb.  Para controlar el crecimiento, el desarrollo y la edad del feto.  Para medir la cantidad de lquido en el saco  amnitico.  Cuando se realiza una versin externa del beb (se lo mueve a Programmer, applications).  Evaluar al beb en embarazos de alto riesgo (perfil biofsico).  Si se sospecha el deceso del beb (muerte). En algunos casos, se utiliza un mtodo especial denominado ecografa con infusin salina, para una observacin ms precisa del tero. Se inyecta solucin salina (agua con sal) dentro del tero en pacientes no embarazadas para observar mejor su interior. Este mtodo no se Designer, fashion/clothing. La probeta tambin puede usarse para obtener biopsias de Educational psychologist, para drenar lquido de quistes de ovario y para Designer, jewellery un DIU (dispositivo intrauterino para el control de la natalidad) que no pueda [place]. PREPARACIN PARA LA PRUEBA La ecografa transvaginal se realiza con la vejiga vaca. La ecografa transabdominal se realiza con la vejiga llena. Podrn solicitarle que beba varios vasos de agua antes del examen. En algunos casos se realiza una ecografa transabdominal antes de la ecografa transvaginal para obervar los rganos del abdomen. PROCEDIMIENTO  Deber acostarse en una cama, con las rodillas dobladas y los pies en los estribos. La probeta se cubre con un condn. Dentro de la vagina y en la probeta se aplica un lubricante estril. El lubricante ayuda a transmitir las ondas sonoras y Fish farm manager la irritacin de la vagina. El mdico mover la sonda en el interior de la cavidad vaginal para escanear las estructuras plvicas. Un examen normal mostrar una pelvis normal y contenidos normales en su interior. Una prueba anormal mostrar anormalidades en la pelvis, la placenta o el beb. LAS CAUSAS DE UN RESULTADO ANORMAL PUEDEN SER:  Crecimientos o tumores en:  El tero.  Los ovarios.  La vagina.  Otras estructuras plvicas.  Crecimientos no cancerosos  en el tero y los ovarios.  El ovario se retuerce y se corta el suministro de [place]  Las reas de  infeccin incluyen:  Enfermedad inflamatoria plvica.  Un absceso en la pelvis.  Ubicacin de un DIU. LOS PROBLEMAS QUE PUEDEN HALLARSE EN UNA MUJER EMBARAZADA SON:  Embarazo ectpico (embarazo fuera del tero).  Embarazos mltiples.  Dilatacin (apertura) precoz anormal del cuello del tero. Esto puede indicar un cuello incompetente y Biomedical scientist.  Aborto inminente.  Muerte fetal.  Los problemas con la placenta incluyen:  La placenta se ha desarrollado sobre la abertura del cuello del tero (placenta previa).  La placenta se ha separado anticipadamente en el tero (abrupcin placentaria).  La placenta se desarrolla en el msculo del tero (placenta acreta).  Tumores del Media planner, incluyendo la enfermedad trofoblstica gestacional. Se trata de un embarazo anormal en el que no hay feto. El tero se llena de quistes similares a uvas que en algunos casos son cancerosos.  Posicin incorrecta del feto (de nalgas, de vrtice).  Retraso del desarrollo fetal intrauterino (escaso desarrollo en el tero).  Anormalidades o infeccin fetal. RIESGOS Y COMPLICACIONES No hay riesgos conocidos para la ecografa. No se toman radiografas cuando se realiza una ecografa. Document Released: 02/23/2009 Document Revised: 01/30/2012 [facility] Patient Information 2015 [place]. This information is not intended to replace advice given to you by your health care provider. Make sure you discuss any questions you have with your health care provider.

## 2015-01-15 NOTE — Progress Notes (Signed)
Brittany Frost 04-19-1976 [id number]   History:    39 y.o.  for annual gyn exam who has not been seen in the office since 2012. Patient with history of recurrent pregnancy losses. Patient is a gravida 4 para 1 Ab3 (first pregnancy resulted in cesarean section, second pregnancy intrauterine fetal demise at 59 months gestation, 2014 and 2015 first trimester spontaneous miscarriages). 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 Patient stated during her pregnancy she had gestational diabetes and also had issues with hypertension. Patient still interested in getting pregnant. Her last menstrual period was 3 weeks ago.  Past medical history,surgical history, family history and social history were all reviewed and documented in the EPIC chart.  Gynecologic History Patient's last menstrual period was 12/16/2014. Contraception: none Last Pap: 2012. Results were: normal Last mammogram: Not indicated. Results were: Not indicated  Obstetric History OB History  Gravida Para Term Preterm AB SAB TAB Ectopic Multiple Living  5 2  1 3 3    1     # Outcome Date GA Lbr Len/2nd Weight Sex Delivery Anes PTL Lv  5 Preterm 12/16/11 [redacted]w[redacted]d  6.7 oz (0.19 kg) F Vag-Spont None  FD     Comments: Multiple anomalies  4 SAB           3 SAB           2 SAB           1 Para                ROS: A ROS was performed and pertinent positives and negatives are included in the history.  GENERAL: No fevers or chills. HEENT: No change in vision, no earache, sore throat or sinus congestion. NECK: No pain or stiffness. CARDIOVASCULAR: No chest pain or pressure. No palpitations. PULMONARY: No shortness of breath, cough  or wheeze. GASTROINTESTINAL: No abdominal pain, nausea, vomiting or diarrhea, melena or bright red blood per rectum. GENITOURINARY: No urinary frequency, urgency, hesitancy or dysuria. MUSCULOSKELETAL: No joint or muscle pain, no back pain, no recent trauma. DERMATOLOGIC: No rash, no itching, no lesions. ENDOCRINE: No polyuria, polydipsia, no heat or cold intolerance. No recent change in weight. HEMATOLOGICAL: No anemia or easy bruising or bleeding. NEUROLOGIC: No headache, seizures, numbness, tingling or weakness. PSYCHIATRIC: No depression, no loss of interest in normal activity or change in sleep pattern.     Exam: chaperone present  BP 118/70 mmHg  Ht 5' 0.5" (1.537 m)  Wt 145 lb (65.772 kg)  BMI 27.84 kg/m2  LMP 12/16/2014  Body mass index is 27.84 kg/(m^2).  General appearance : Well developed well nourished female. No acute distress HEENT: Eyes: no retinal hemorrhage or exudates,  Neck supple, trachea midline, no carotid bruits, no thyroidmegaly Lungs: Clear to auscultation, no rhonchi or wheezes, or rib retractions  Heart: Regular rate and rhythm, no murmurs or gallops Breast:Examined in sitting and supine position were symmetrical in appearance, no palpable masses or tenderness,  no skin retraction, no nipple inversion, no nipple discharge, no skin discoloration, no axillary or supraclavicular lymphadenopathy Abdomen: no palpable masses or tenderness, no rebound or guarding Extremities: no edema or skin discoloration or tenderness  Pelvic:  B[place]olin, Urethra, Skene Glands: Within normal limits  Vagina: No gross lesions or discharge  Cervix: No gross lesions or discharge  Uterus  retroverted irregular shaped, , non-tender and mobile  Adnexa  Without masses or tenderness  Anus and perineum  normal   Rectovaginal  normal sphincter tone without palpated masses or tenderness             Hemoccult not indicated     Assessment/Plan:  39 y.o. female for annual exam with  recurrent pregnancy losses will be scheduled for sonohysterogram here in the office in 1-2 weeks to make sure that there is no intracavitary polyps or myoma because of her past history. The following screening labs were ordered: CBC, screening cholesterol, compresses a metabolic panel, TSH and urinalysis. Pap smear with HPV screening was obtained today as well. Patient was reminded to do her monthly breast exam. She consumes 4 cups of coffee daily this may be contributing to her mastodynia which I would recommended she cut down to at least one cup a day.   Terrance Mass MD, 12:02 PM 01/15/2015

## 2015-01-16 ENCOUNTER — Other Ambulatory Visit: Payer: Self-pay | Admitting: Gynecology

## 2015-01-16 DIAGNOSIS — N96 Recurrent pregnancy loss: Secondary | ICD-10-CM

## 2015-01-16 LAB — URINALYSIS W MICROSCOPIC + REFLEX CULTURE
Bilirubin Urine: NEGATIVE
CASTS: NONE SEEN
CRYSTALS: NONE SEEN
GLUCOSE, UA: NEGATIVE mg/dL
Hgb urine dipstick: NEGATIVE
Ketones, ur: NEGATIVE mg/dL
LEUKOCYTES UA: NEGATIVE
Nitrite: NEGATIVE
PH: 7.5 (ref 5.0–8.0)
Protein, ur: NEGATIVE mg/dL
SPECIFIC GRAVITY, URINE: 1.024 (ref 1.005–1.030)
Urobilinogen, UA: 0.2 mg/dL (ref 0.0–1.0)

## 2015-01-17 LAB — URINE CULTURE

## 2015-01-19 LAB — CYTOLOGY - PAP

## 2015-01-26 ENCOUNTER — Other Ambulatory Visit: Payer: Self-pay

## 2015-01-26 ENCOUNTER — Other Ambulatory Visit: Payer: Self-pay | Admitting: Gynecology

## 2015-01-26 ENCOUNTER — Encounter: Payer: Self-pay | Admitting: Gynecology

## 2015-01-26 ENCOUNTER — Ambulatory Visit (INDEPENDENT_AMBULATORY_CARE_PROVIDER_SITE_OTHER): Payer: Self-pay | Admitting: Gynecology

## 2015-01-26 ENCOUNTER — Ambulatory Visit (INDEPENDENT_AMBULATORY_CARE_PROVIDER_SITE_OTHER): Payer: Self-pay

## 2015-01-26 DIAGNOSIS — N9489 Other specified conditions associated with female genital organs and menstrual cycle: Secondary | ICD-10-CM

## 2015-01-26 DIAGNOSIS — D251 Intramural leiomyoma of uterus: Secondary | ICD-10-CM

## 2015-01-26 DIAGNOSIS — D500 Iron deficiency anemia secondary to blood loss (chronic): Secondary | ICD-10-CM

## 2015-01-26 DIAGNOSIS — R9389 Abnormal findings on diagnostic imaging of other specified body structures: Secondary | ICD-10-CM

## 2015-01-26 DIAGNOSIS — N96 Recurrent pregnancy loss: Secondary | ICD-10-CM

## 2015-01-26 DIAGNOSIS — R938 Abnormal findings on diagnostic imaging of other specified body structures: Secondary | ICD-10-CM

## 2015-01-26 DIAGNOSIS — N840 Polyp of corpus uteri: Secondary | ICD-10-CM

## 2015-01-26 NOTE — Progress Notes (Signed)
   Patient presented to the office todayfor sonohysterogram as part of her evaluation for recurrent pregnancy losses.Patient with history of recurrent pregnancy losses. Patient is a gravida 4 para 1 Ab3 (first pregnancy resulted in cesarean section, second pregnancy intrauterine fetal demise at 57 months gestation, 2014 and 2015 first trimester spontaneous miscarriages).she was seen in the office on [address] for her annual gynecological examination. See previous note for additional details. As part of her annual gynecological exam her CBC had demonstrated her hemoglobin was at 10. Patient stated with her last miscarriage in December she had almost bled for the entire month. In January February she had normal five-day cycles before heavy. She is currently not using any form of contraception and would like to try a pregnant again after evaluation is completed.  Patient was counseled for sonohysterogram/ultrasound today as follows: A sterile speculum was introduced into the vagina. A catheter was introduced into the uterine cavity whereby normal saline was instilled and 2 intrauterine defects were noted one was noted anteriorly which measured 25 x 13 mm and the posterior one measuring 18 x 18 mm with feeder vessels noted. Right and left ovary were normal. A small intramural fibroid measuring 34 x 25 mm was noted.  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 She will try to make arrangements after discussing with her husband.

## 2015-01-27 ENCOUNTER — Encounter: Payer: Self-pay | Admitting: Gynecology

## 2015-02-18 ENCOUNTER — Telehealth: Payer: Self-pay

## 2015-02-18 NOTE — Telephone Encounter (Signed)
Patient is private pay and had asked how much all the surgery related costs will be. I checked with all facilities/services and Rosemarie Ax called her to day to relay this information in [place].  Rosemarie Ax spoke with the patient and then Rosemarie Ax messaged me and said "She will call me to let me know if or when she wants surgery."  I will wait to hear from the patient.

## 2015-10-10 ENCOUNTER — Ambulatory Visit (INDEPENDENT_AMBULATORY_CARE_PROVIDER_SITE_OTHER): Payer: Self-pay | Admitting: Internal Medicine

## 2015-10-10 VITALS — BP 116/82 | HR 82 | Temp 98.3°F | Resp 16 | Ht 61.0 in | Wt 139.2 lb

## 2015-10-10 DIAGNOSIS — N760 Acute vaginitis: Secondary | ICD-10-CM

## 2015-10-10 DIAGNOSIS — A499 Bacterial infection, unspecified: Secondary | ICD-10-CM

## 2015-10-10 DIAGNOSIS — B9689 Other specified bacterial agents as the cause of diseases classified elsewhere: Secondary | ICD-10-CM

## 2015-10-10 LAB — POCT WET + KOH PREP
TRICH BY WET PREP: ABSENT
Yeast by KOH: ABSENT
Yeast by wet prep: ABSENT

## 2015-10-10 MED ORDER — METRONIDAZOLE 500 MG PO TABS: 500.0000 mg | ORAL_TABLET | Freq: Two times a day (BID) | ORAL | Status: DC

## 2015-10-10 NOTE — Progress Notes (Signed)
   Subjective:    Patient ID: Brittany Frost, female    DOB: 04-30-1976, 39 y.o.   MRN: [id number]  HPIc/o vag d/c w odor 2 weeks-3 wks Started 1 week bef last menses which were nl ending 11/11 Intermittent crampy pain bilat LQs No fever,dysuria This is new partner x 77mos w/out contracep (has 1 ch, would love another--see freq misc)  Review of Systems [place]    Objective:   Physical Exam BP 116/82 mmHg  Pulse 82  Temp(Src) 98.3 F (36.8 C) (Oral)  Resp 16  Ht 5\' 1"  (1.549 m)  Wt 139 lb 3.2 oz (63.141 kg)  BMI 26.32 kg/m2  SpO2 98%  LMP 09/26/2015 Introitus has a white discharge vault is not inflamed with copious white foul-smelling discharge Eyes clear Uterus mid position and nontender No adnexal masses or tenderness  Results for orders placed or performed in visit on 10/10/15  POCT Wet + KOH Prep  Result Value Ref Range   Yeast by KOH Absent Present, Absent   Yeast by wet prep Absent Present, Absent   WBC by wet prep Moderate (A) None, Few, Too numerous to count   Clue Cells Wet Prep HPF POC Moderate (A) None, Too numerous to count   Trich by wet prep Absent Present, Absent   Bacteria Wet Prep HPF POC Moderate (A) None, Few, Too numerous to count   Epithelial Cells By [employer] ([facility]) Few None, Few, Too numerous to count   RBC,UR,HPF,POC None None RBC/hpf        Assessment & Plan:  Vaginitis - Plan: POCT Wet + KOH Prep  Bacterial vaginosis  Meds ordered this encounter  Medications  . metroNIDAZOLE (FLAGYL) 500 MG tablet    Sig: Take 1 tablet (500 mg total) by mouth 2 (two) times daily.    Dispense:  14 tablet    Refill:  1   Agrees to not doing genprobe Fu Dr Toney Rakes

## 2015-10-10 NOTE — Patient Instructions (Signed)
Vaginosis bacteriana (Bacterial Vaginosis) La vaginosis bacteriana es una infeccin vaginal que perturba el equilibrio normal de las bacterias que se encuentran en la vagina. Es el resultado de un crecimiento excesivo de ciertas bacterias. Esta es la infeccin vaginal ms frecuente en mujeres en edad reproductiva. El tratamiento es importante para prevenir complicaciones, especialmente en mujeres embarazadas, dado que puede causar un parto prematuro. CAUSAS  La vaginosis bacteriana se origina por un aumento de bacterias nocivas que, generalmente, estn presentes en cantidades ms pequeas en la vagina. Varios tipos diferentes de bacterias pueden causar esta afeccin. Sin embargo, la causa de su desarrollo no se comprende totalmente. [place] o comportamientos pueden exponerlo a un mayor riesgo de desarrollar vaginosis bacteriana, entre los que se incluyen:  Tener una nueva pareja sexual o mltiples parejas sexuales.  Las duchas vaginales  El uso del DIU (dispositivo intrauterino) como mtodo anticonceptivo. El contagio no se produce en baos, por ropas de cama, en piscinas o por contacto con objetos. SIGNOS Y SNTOMAS  Algunas mujeres que padecen vaginosis bacteriana no presentan signos ni sntomas. Los sntomas ms comunes son:  Secrecin vaginal de color grisceo.  Secrecin vaginal con olor similar al [employer], especialmente despus de Retail banker.  Picazn o sensacin de ardor en la vagina o la vulva.  Ardor o dolor al [employer]. DIAGNSTICO  Su mdico analizar su historia clnica y le examinar la vagina para detectar signos de vaginosis bacteriana. Puede tomarle Truddie Coco de flujo vaginal. Su mdico examinar esta muestra con un microscopio para controlar las bacterias y clulas anormales. Tambin puede realizarse un anlisis del pH vaginal.  TRATAMIENTO  La vaginosis bacteriana puede tratarse con antibiticos, en forma de comprimidos o  de crema vaginal. Puede indicarse una segunda tanda de antibiticos si la afeccin se repite despus del tratamiento. Debido a que la vaginosis bacteriana aumenta el riesgo de contraer enfermedades de transmisin sexual, el tratamiento puede ayudar a reducir el riesgo de clamidia, [place], VIH y herpes. [place] solo medicamentos de venta libre o recetados, segn las indicaciones del mdico.  Si le han recetado antibiticos, tmelos como se le indic. Asegrese de que finaliza la prescripcin completa aunque se sienta mejor.  Comunique a sus compaeros sexuales que sufre una infeccin vaginal. Deben consultar a su mdico y recibir tratamiento si tienen problemas, como picazn o una erupcin cutnea leve.  Durante el [place], es importante que siga estas indicaciones:  Visual merchandiser relaciones sexuales o use preservativos de la forma correcta.  No se haga duchas vaginales.  Evite consumir alcohol como se lo haya indicado el mdico.  Community education officer se lo haya indicado el mdico. SOLICITE ATENCIN MDICA SI:   Sus sntomas no mejoran despus de 3 das de [place].  Aumenta la secrecin o Conservation officer, historic buildings.  Tiene fiebre. ASEGRESE DE QUE:   Comprende estas instrucciones.  Controlar su afeccin.  Recibir ayuda de inmediato si no mejora o si empeora. PARA OBTENER MS INFORMACIN  Centros para el control y la prevencin de Probation officer for Disease Control and Prevention, CDC): AppraiserFraud.fi Asociacin Estadounidense de la Salud Sexual (American Sexual Health Association, SHA): www.ashastd.org    Esta informacin no tiene Marine scientist el consejo del mdico. Asegrese de hacerle al mdico cualquier pregunta que tenga.   Document Released: 02/14/2008 Document Revised: 11/28/2014 Elsevier Interactive Patient Education 2016 Elsevier Inc. Bacterial Vaginosis Bacterial vaginosis is a vaginal infection that occurs when the  normal  balance of bacteria in the vagina is disrupted. It results from an overgrowth of certain bacteria. This is the most common vaginal infection in women of childbearing age. Treatment is important to prevent complications, especially in pregnant women, as it can cause a premature delivery. CAUSES  Bacterial vaginosis is caused by an increase in harmful bacteria that are normally present in smaller amounts in the vagina. Several different kinds of bacteria can cause bacterial vaginosis. However, the reason that the condition develops is not fully understood. RISK FACTORS Certain activities or behaviors can put you at an increased risk of developing bacterial vaginosis, including:  Having a new sex partner or multiple sex partners.  Douching.  Using an intrauterine device (IUD) for contraception. Women do not get bacterial vaginosis from toilet seats, bedding, swimming pools, or contact with objects around them. SIGNS AND SYMPTOMS  Some women with bacterial vaginosis have no signs or symptoms. Common symptoms include:  Grey vaginal discharge.  A fishlike odor with discharge, especially after sexual intercourse.  Itching or burning of the vagina and vulva.  Burning or pain with urination. DIAGNOSIS  Your health care provider will take a medical history and examine the vagina for signs of bacterial vaginosis. A sample of vaginal fluid may be taken. Your health care provider will look at this sample under a microscope to check for bacteria and abnormal cells. A vaginal pH test may also be done.  TREATMENT  Bacterial vaginosis may be treated with antibiotic medicines. These may be given in the form of a pill or a vaginal cream. A second round of antibiotics may be prescribed if the condition comes back after treatment. Because bacterial vaginosis increases your risk for sexually transmitted diseases, getting treated can help reduce your risk for chlamydia, gonorrhea, HIV, and herpes. HOME  CARE INSTRUCTIONS   Only take over-the-counter or prescription medicines as directed by your health care provider.  If antibiotic medicine was prescribed, take it as directed. Make sure you finish it even if you start to feel better.  Tell all sexual partners that you have a vaginal infection. They should see their health care provider and be treated if they have problems, such as a mild rash or itching.  During treatment, it is important that you follow these instructions:  Avoid sexual activity or use condoms correctly.  Do not douche.  Avoid alcohol as directed by your health care provider while on flagyl.  Avoid breastfeeding as directed by your health care provider. SEEK MEDICAL CARE IF:   Your symptoms are not improving after 3 days of treatment.  You have increased discharge or pain.  You have a fever. MAKE SURE YOU:   Understand these instructions.  Will watch your condition.  Will get help right away if you are not doing well or get worse. FOR MORE INFORMATION  Centers for Disease Control and Prevention, Division of STD Prevention: AppraiserFraud.fi American Sexual Health Association (ASHA): www.ashastd.org    This information is not intended to replace advice given to you by your health care provider. Make sure you discuss any questions you have with your health care provider.   Document Released: 11/07/2005 Document Revised: 11/28/2014 Document Reviewed: 06/19/2013 Elsevier Interactive Patient Education [employer].

## 2017-03-13 ENCOUNTER — Ambulatory Visit (INDEPENDENT_AMBULATORY_CARE_PROVIDER_SITE_OTHER): Payer: Self-pay | Admitting: Physician Assistant

## 2017-03-13 VITALS — BP 126/77 | HR 72 | Temp 98.1°F | Resp 16 | Ht 61.0 in | Wt 144.8 lb

## 2017-03-13 DIAGNOSIS — B373 Candidiasis of vulva and vagina: Secondary | ICD-10-CM

## 2017-03-13 DIAGNOSIS — B3731 Acute candidiasis of vulva and vagina: Secondary | ICD-10-CM

## 2017-03-13 DIAGNOSIS — D500 Iron deficiency anemia secondary to blood loss (chronic): Secondary | ICD-10-CM

## 2017-03-13 DIAGNOSIS — B9689 Other specified bacterial agents as the cause of diseases classified elsewhere: Secondary | ICD-10-CM

## 2017-03-13 DIAGNOSIS — N949 Unspecified condition associated with female genital organs and menstrual cycle: Secondary | ICD-10-CM

## 2017-03-13 DIAGNOSIS — N898 Other specified noninflammatory disorders of vagina: Secondary | ICD-10-CM

## 2017-03-13 DIAGNOSIS — N760 Acute vaginitis: Secondary | ICD-10-CM

## 2017-03-13 DIAGNOSIS — Z7251 High risk heterosexual behavior: Secondary | ICD-10-CM

## 2017-03-13 LAB — POCT WET + KOH PREP
Trich by wet prep: ABSENT
YEAST BY WET PREP: ABSENT

## 2017-03-13 LAB — POCT CBC
GRANULOCYTE PERCENT: 60.9 % (ref 37–80)
HEMATOCRIT: 25.8 % — AB (ref 37.7–47.9)
HEMOGLOBIN: 7.9 g/dL — AB (ref 12.2–16.2)
Lymph, poc: 3.1 (ref 0.6–3.4)
MCH, POC: 17.6 pg — AB (ref 27–31.2)
MCHC: 30.7 g/dL — AB (ref 31.8–35.4)
MCV: 57.2 fL — AB (ref 80–97)
MID (cbc): 0.7 (ref 0–0.9)
MPV: 7.6 fL (ref 0–99.8)
PLATELET COUNT, POC: 495 10*3/uL — AB (ref 142–424)
POC GRANULOCYTE: 5.8 (ref 2–6.9)
POC LYMPH %: 31.8 % (ref 10–50)
POC MID %: 7.3 %M (ref 0–12)
RBC: 4.51 M/uL (ref 4.04–5.48)
RDW, POC: 21.2 %
WBC: 9.6 10*3/uL (ref 4.6–10.2)

## 2017-03-13 LAB — POC MICROSCOPIC URINALYSIS (UMFC)

## 2017-03-13 LAB — POCT URINALYSIS DIP (MANUAL ENTRY)
BILIRUBIN UA: NEGATIVE mg/dL
Bilirubin, UA: NEGATIVE
Glucose, UA: NEGATIVE mg/dL
Leukocytes, UA: NEGATIVE
Nitrite, UA: NEGATIVE
PH UA: 6 (ref 5.0–8.0)
UROBILINOGEN UA: 0.2 U/dL

## 2017-03-13 LAB — POCT URINE PREGNANCY: PREG TEST UR: NEGATIVE

## 2017-03-13 MED ORDER — IRON-FOLIC ACID-C-B6-B12-ZINC 150-1.25 MG PO TABS: 1.0000 | ORAL_TABLET | Freq: Every day | ORAL | 12 refills | Status: DC

## 2017-03-13 MED ORDER — FLUCONAZOLE 150 MG PO TABS: 150.0000 mg | ORAL_TABLET | Freq: Once | ORAL | 0 refills | Status: AC

## 2017-03-13 MED ORDER — METRONIDAZOLE 500 MG PO TABS: 500.0000 mg | ORAL_TABLET | Freq: Two times a day (BID) | ORAL | 0 refills | Status: DC

## 2017-03-13 NOTE — Patient Instructions (Signed)
     IF you received an x-ray today, you will receive an invoice from [facility]. Please contact [facility] at [phone] with questions or concerns regarding your invoice.   IF you received labwork today, you will receive an invoice from [facility]. Please contact [facility] at [phone] with questions or concerns regarding your invoice.   Our billing staff will not be able to assist you with questions regarding bills from these companies.  You will be contacted with the lab results as soon as they are available. The fastest way to get your results is to activate your My Chart account. Instructions are located on the last page of this paperwork. If you have not heard from us regarding the results in 2 weeks, please contact this office.     

## 2017-03-13 NOTE — Progress Notes (Signed)
Patient ID: Brittany Frost, female    DOB: 1976-09-09, 41 y.o.   MRN: [id number]  PCP: No PCP Per Patient  Chief Complaint  Patient presents with  . Vaginal Discharge    light bloody discharge with fishy odor, lower abd pain,since yesterday   . Exposure to STD    wants to be tested     Subjective:   Presents for evaluation of vaginal discharge and testing for STI.Marland Kitchen  2 months ago noticed a fishy vaginal odor. "Like something dead." Had a normal period, and then noticed watery vaginal discharge that was pink with blood yesterday. Uses both tampons and pads.  Sexually active with a boyfriend x 4 months. Initially used condoms, but not now. No concerns for other partners.  Sometimes has some vaginal itching/burning. No urinary symptoms. Some intermittent fever/chills, usually associated with the allergy season.   Review of Systems As above.    Patient Active Problem List   Diagnosis Date Noted  . Endometrium, polyp 01/26/2015  . Anemia due to chronic blood loss 01/26/2015  . History of recurrent miscarriages, not currently pregnant 01/15/2015  . History of gestational diabetes 01/15/2015     Prior to Admission medications   Not on File     No Known Allergies     Objective:  Physical Exam  Constitutional: She is oriented to person, place, and time. She appears well-developed and well-nourished. She is active and cooperative. No distress.  BP 126/77 (BP Location: Right Arm, Patient Position: Sitting, Cuff Size: Small)   Pulse 72   Temp 98.1 F (36.7 C) (Oral)   Resp 16   Ht 5\' 1"  (1.549 m)   Wt 144 lb 12.8 oz (65.7 kg)   LMP 02/28/2017   SpO2 100%   BMI 27.36 kg/m    Eyes: Conjunctivae are normal.  Pulmonary/Chest: Effort normal.  Abdominal: Hernia confirmed negative in the right inguinal area and confirmed negative in the left inguinal area.  Genitourinary: Uterus normal. Pelvic exam was performed with patient supine. No labial fusion. There is no  rash, tenderness, lesion or injury on the right labia. There is no rash, tenderness, lesion or injury on the left labia. Cervix exhibits motion tenderness (unclear if this is worse than her baseline. ). Cervix exhibits no discharge and no friability. Right adnexum displays no mass, no tenderness and no fullness. Left adnexum displays no mass, no tenderness and no fullness. There is bleeding in the vagina. No erythema or tenderness in the vagina. No foreign body in the vagina. No signs of injury around the vagina. Vaginal discharge found.  Lymphadenopathy:       Right: No inguinal adenopathy present.       Left: No inguinal adenopathy present.  Neurological: She is alert and oriented to person, place, and time.  Psychiatric: She has a normal mood and affect. Her speech is normal and behavior is normal.    Results for orders placed or performed in visit on 03/13/17  POCT urine pregnancy  Result Value Ref Range   Preg Test, Ur Negative Negative  POCT Wet + KOH Prep  Result Value Ref Range   Yeast by KOH Present (A) Absent   Yeast by wet prep Absent Absent   WBC by wet prep None (A) Few   Clue Cells Wet Prep HPF POC Many (A) None   Trich by wet prep Absent Absent   Bacteria Wet Prep HPF POC Many (A) Few   Epithelial Cells By [employer] (  [facility]) None None, Few, Too numerous to count   RBC,UR,HPF,POC Too numerous to count  (A) None RBC/hpf  POCT CBC  Result Value Ref Range   WBC 9.6 4.6 - 10.2 K/uL   Lymph, poc 3.1 0.6 - 3.4   POC LYMPH PERCENT 31.8 10 - 50 %L   MID (cbc) 0.7 0 - 0.9   POC MID % 7.3 0 - 12 %M   POC Granulocyte 5.8 2 - 6.9   Granulocyte percent 60.9 37 - 80 %G   RBC 4.51 4.04 - 5.48 M/uL   Hemoglobin 7.9 (A) 12.2 - 16.2 g/dL   HCT, POC 25.8 (A) 37.7 - 47.9 %   MCV 57.2 (A) 80 - 97 fL   MCH, POC 17.6 (A) 27 - 31.2 pg   MCHC 30.7 (A) 31.8 - 35.4 g/dL   RDW, POC 21.2 %   Platelet Count, POC 495 (A) 142 - 424 K/uL   MPV 7.6 0 - 99.8 fL  POCT urinalysis dipstick  Result  Value Ref Range   Color, UA yellow yellow   Clarity, UA cloudy (A) clear   Glucose, UA negative negative mg/dL   Bilirubin, UA negative negative   Ketones, POC UA negative negative mg/dL   Spec Grav, UA >=1.030 (A) 1.010 - 1.025   Blood, UA large (A) negative   pH, UA 6.0 5.0 - 8.0   Protein Ur, POC trace (A) negative mg/dL   Urobilinogen, UA 0.2 0.2 or 1.0 E.U./dL   Nitrite, UA Negative Negative   Leukocytes, UA Negative Negative  POCT Microscopic Urinalysis ([facility])  Result Value Ref Range   WBC,UR,HPF,POC Moderate (A) None WBC/hpf   RBC,UR,HPF,POC Too numerous to count  (A) None RBC/hpf   Bacteria Many (A) None, Too numerous to count   Mucus Present (A) Absent   Epithelial Cells, UR Per Microscopy Many (A) None, Too numerous to count cells/hpf       Assessment & Plan:   Problem List Items Addressed This Visit    Anemia due to chronic blood loss    Recommend follow-up with GYN regarding persistent anemia from heavy menses.      Relevant Medications   Iron-Folic [id number] 150-1.25 MG TABS   Other Relevant Orders   Ambulatory referral to Gynecology    Other Visit Diagnoses    Vaginal discharge    -  Primary   Relevant Orders   POCT Wet + KOH Prep (Completed)   GC/Chlamydia Probe Amp (Completed)   Unprotected sexual intercourse       Screening for STIs   Relevant Orders   POCT urine pregnancy (Completed)   RPR (Completed)   HIV antibody (Completed)   CMT (cervical motion tenderness)       Await STI test results. Treat BV and yeast.    Relevant Orders   POCT CBC (Completed)   POCT urinalysis dipstick (Completed)   POCT Microscopic Urinalysis ([facility]) (Completed)   BV (bacterial vaginosis)       Relevant Medications   metroNIDAZOLE (FLAGYL) 500 MG tablet   Yeast vaginitis       Relevant Medications   metroNIDAZOLE (FLAGYL) 500 MG tablet       No Follow-up on file.   Fara Chute, PA-C [facility]

## 2017-03-14 LAB — HIV ANTIBODY (ROUTINE TESTING W REFLEX): HIV Screen 4th Generation wRfx: NONREACTIVE

## 2017-03-14 LAB — RPR: RPR: NONREACTIVE

## 2017-03-15 LAB — GC/CHLAMYDIA PROBE AMP
Chlamydia trachomatis, NAA: NEGATIVE
Neisseria gonorrhoeae by PCR: NEGATIVE

## 2017-03-18 NOTE — Assessment & Plan Note (Signed)
Recommend follow-up with GYN regarding persistent anemia from heavy menses.

## 2017-04-05 ENCOUNTER — Encounter: Payer: Self-pay | Admitting: Gynecology

## 2019-10-21 ENCOUNTER — Other Ambulatory Visit: Payer: Self-pay

## 2019-10-22 ENCOUNTER — Ambulatory Visit (INDEPENDENT_AMBULATORY_CARE_PROVIDER_SITE_OTHER): Payer: Self-pay | Admitting: Women's Health

## 2019-10-22 ENCOUNTER — Encounter: Payer: Self-pay | Admitting: Women's Health

## 2019-10-22 VITALS — BP 122/80 | Ht 60.0 in | Wt 141.0 lb

## 2019-10-22 DIAGNOSIS — Z01419 Encounter for gynecological examination (general) (routine) without abnormal findings: Secondary | ICD-10-CM

## 2019-10-22 MED ORDER — ACYCLOVIR 400 MG PO TABS: 400.0000 mg | ORAL_TABLET | Freq: Four times a day (QID) | ORAL | 6 refills | Status: DC

## 2019-10-22 MED ORDER — METRONIDAZOLE 500 MG PO TABS: 500.0000 mg | ORAL_TABLET | Freq: Two times a day (BID) | ORAL | 0 refills | Status: DC

## 2019-10-22 NOTE — Addendum Note (Signed)
Addended by: Janalyn Harder A on: 10/22/2019 10:30 AM   Modules accepted: Orders

## 2019-10-22 NOTE — Progress Notes (Signed)
Brittany Frost 1976/11/16 [id number]    History:    Presents for annual exam.  Monthly 7 to 8-day cycle no contraception no pregnancy in 12 years.  Fertility treatment for pregnancy history of SABs.  2016 sonohysterogram with probable endometrial polyp did not follow-up for hysteroscope.  Normal Pap history, has not had a screening mammogram.  Boyfriend recently diagnosed with HSV states has never had nor currently has any symptoms.  Past medical history, past surgical history, family history and social history were all reviewed and documented in the EPIC chart.  Self-employed floral arrangements.  Son is 59.  Originally from [place], parents still live there, has 2 sisters who live in [place].  Mother hypertension, parents are not diabetic.  History of GDM.  ROS:  A ROS was performed and pertinent positives and negatives are included.  Exam:  Vitals:   10/22/19 0935  BP: 122/80  Weight: 141 lb (64 kg)  Height: 5' (1.524 m)   Body mass index is 27.54 kg/m.   General appearance:  Normal Thyroid:  Symmetrical, normal in size, without palpable masses or nodularity. Respiratory  Auscultation:  Clear without wheezing or rhonchi Cardiovascular  Auscultation:  Regular rate, without rubs, murmurs or gallops  Edema/varicosities:  Not grossly evident Abdominal  Soft,nontender, without masses, guarding or rebound.  Liver/spleen:  No organomegaly noted  Hernia:  None appreciated  Skin  Inspection:  Grossly normal   Breasts: Examined lying and sitting.     Right: Without masses, retractions, discharge or axillary adenopathy.     Left: Without masses, retractions, discharge or axillary adenopathy. Gentitourinary   Inguinal/mons:  Normal without inguinal adenopathy  External genitalia:  Normal  BUS/Urethra/Skene's glands:  Normal  Vagina:  Normal  Cervix:  Normal  Uterus:  normal in size, shape and contour.  Midline and mobile  Adnexa/parametria:     Rt: Without masses or  tenderness.   Lt: Without masses or tenderness.  Anus and perineum: Normal  Digital rectal exam: Normal sphincter tone without palpated masses or tenderness  Assessment/Plan:  43 y.o. SHF G5, P1 for annual exam.    Partner recent HSV infection Monthly 7 to 8-day cycle/history of infertility Questionable endometrial polyps without follow-up in 2016 History of anemia  Plan: Contraception discussed, declines.  Prescription for acyclovir 400 mg to take 4-5 times daily for 3 to 5 days if needed prescription given, reviewed may never have an outbreak, may have had a mild case in the past.  Sonohysterogram reviewed, encouraged to follow-up declines at this time.  SBEs, breast center scholarship information given instructed to fill out and mail in, reviewed importance of annual screening mammograms.  Exercise, calcium rich foods, MVI with iron daily encouraged iron rich foods as well.  CBC, glucose, Pap with HR HPV typing, new screening guidelines reviewed. Without insurance instructed to go to the health department to get an STD screen agreeable with plan.    [place], 10:16 AM 10/22/2019

## 2019-10-22 NOTE — Patient Instructions (Signed)
Mantenimiento de Technical sales engineer en [place] Maintenance, Female Adoptar un estilo de vida saludable y recibir atencin preventiva son importantes para promover la salud y Musician. Consulte al mdico sobre:  El esquema adecuado para hacerse pruebas y exmenes peridicos.  Cosas que puede hacer por su cuenta para prevenir enfermedades y [employer]. Qu debo saber sobre la dieta, el peso y el ejercicio? Consuma una dieta saludable   Consuma una dieta que incluya muchas verduras, frutas, productos lcteos con bajo contenido de [place] y Advertising account planner.  No consuma muchos alimentos ricos en grasas slidas, azcares agregados o sodio. Mantenga un peso saludable El ndice de masa muscular [facility] [place]) se [place] para identificar problemas de [place]. Proporciona una estimacin de la grasa corporal basndose en el peso y la altura. Su mdico puede ayudarle a Radiation protection practitioner [place] y a Scientist, forensic o Theatre manager un peso saludable. Haga ejercicio con regularidad Haga ejercicio con regularidad. Esta es una de las prcticas ms importantes que puede hacer por su salud. La mayora de los adultos deben seguir estas pautas:  Optometrist, al menos, 185mnutos de actividad fsica por semana. El ejercicio debe aumentar la frecuencia cardaca y hNature conservation officertranspirar (ejercicio de intensidad moderada).  Hacer ejercicios de fortalecimiento por lo mHalliburton Companypor semana. Agregue esto a su plan de ejercicio de intensidad moderada.  Pasar menos tiempo sentados. Incluso la actividad fsica ligera puede ser beneficiosa. Controle sus niveles de colesterol y lpidos en la sangre Comience a realizarse anlisis de lpidos y cResearch officer, trade unionen la sangre a los 20aos y luego reptalos cada 5aos. Hgase controlar los niveles de colesterol con mayor frecuencia si:  Sus niveles de lpidos y colesterol son altos.  Es mayor de 40aos.  Presenta un alto riesgo de padecer enfermedades cardacas. Qu debo saber sobre las pruebas de  deteccin del cncer? Segn su historia clnica y sus antecedentes familiares, es posible que deba realizarse pruebas de deteccin del cncer en diferentes edades. Esto puede incluir pruebas de deteccin de lo siguiente:  Cncer de mama.  Cncer de cuello uterino.  Cncer colorrectal.  Cncer de piel.  Cncer de pulmn. Qu debo saber sobre la enfermedad cardaca, la diabetes y la hipertensin arterial? Presin arterial y enfermedad cardaca  La hipertensin arterial causa enfermedades cardacas y aSerbiael riesgo de accidente cerebrovascular. Es ms probable que esto se manifieste en las personas que tienen lecturas de presin arterial alta, tienen ascendencia africana o tienen sobrepeso.  Hgase controlar la presin arterial: ? Cada 3 a 5 aos si tiene entre 18 y 389aos. ? Todos los aos si es mayor de 4[place] Diabetes Realcese exmenes de deteccin de la diabetes con regularidad. Este anlisis revisa el nivel de azcar en la sangre en a[place]hurst Hgase las pruebas de deteccin:  Cada tresaos despus de los 428aosde edad si tiene un peso normal y un bajo riesgo de padecer diabetes.  Con ms frecuencia y a partir de uSt. Petersedad inferior si tiene sobrepeso o un alto riesgo de padecer diabetes. Qu debo saber sobre la prevencin de infecciones? Hepatitis B Si tiene un riesgo ms alto de contraer hepatitis B, debe someterse a un examen de deteccin de este virus. Hable con el mdico para averiguar si tiene riesgo de contraer la infeccin por hepatitis B. Hepatitis C Se recomienda el anlisis a:  THexion Specialty Chemicals1945 y 1965.  Todas las personas que tengan un riesgo de haber contrado hepatitis C. Enfermedades de transmisin sexual (ETS)  Hgase las  pruebas de deteccin de ITS, incluidas la gonorrea y la clamidia, si: ? Es sexualmente activa y es menor de [id number]. ? Es mayor de 24aos, y Investment banker, operational informa que corre riesgo de tener este tipo de infecciones. ?  La actividad sexual ha cambiado desde que le hicieron la ltima prueba de deteccin y tiene un riesgo mayor de Best boy clamidia o Radio broadcast assistant. Pregntele al mdico si usted tiene riesgo.  Pregntele al mdico si usted tiene un alto riesgo de Museum/gallery curator VIH. El mdico tambin puede recomendarle un medicamento recetado para ayudar a evitar la infeccin por el VIH. Si elige tomar medicamentos para prevenir el VIH, primero debe [employer] de deteccin del VIH. Luego debe hacerse anlisis cada 13meses mientras est tomando los medicamentos. Embarazo  Si est por dejar de Librarian, academic (fase premenopusica) y usted puede quedar [place], busque asesoramiento antes de [place].  Tome de 400 a [id number] (mcg) de cido [employer] si [place].  Pida mtodos de control de la natalidad (anticonceptivos) si desea evitar un embarazo no deseado. Osteoporosis y [place] La osteoporosis es una enfermedad en la que los huesos pierden los minerales y la fuerza por el avance de la edad. El resultado pueden ser fracturas en los [place]. Si tiene 65aos o ms, o si est en riesgo de sufrir osteoporosis y fracturas, pregunte a su mdico si debe:  Hacerse pruebas de deteccin de prdida sea.  Tomar un suplemento de calcio o de vitamina D para reducir el riesgo de fracturas.  Recibir terapia de reemplazo hormonal (TRH) para tratar los sntomas de la menopausia. Siga estas instrucciones en su casa: Estilo de vida  No consuma ningn producto que contenga nicotina o tabaco, como cigarrillos, cigarrillos electrnicos y tabaco de Higher education careers adviser. Si necesita ayuda para dejar de fumar, consulte al mdico.  No consuma drogas.  No comparta agujas.  Solicite ayuda a su mdico si necesita apoyo o informacin para abandonar las drogas. Consumo de alcohol  No beba alcohol si: ? Su mdico le indica no hacerlo. ? Est embarazada, puede estar embarazada o est tratando de quedar embarazada.   Si bebe alcohol: ? Limite la cantidad que consume de 0 a 1 medida por da. ? Limite la ingesta si est amamantando.  Est atento a la cantidad de alcohol que hay en las bebidas que toma. En los [place], una medida equivale a una botella de cerveza de 12oz (362ml), un vaso de vino de 5oz (182ml) o un vaso de una bebida alcohlica de alta graduacin de 1oz (9ml). Instrucciones generales  Realcese los estudios de rutina de la salud, dentales y de Public librarian.  [place].  Infrmele a su mdico si: ? Se siente deprimida con frecuencia. ? Alguna vez ha sido vctima de [place] o no se siente segura en su casa. Resumen  Adoptar un estilo de vida saludable y recibir atencin preventiva son importantes para promover la salud y Musician.  Siga las instrucciones del mdico acerca de una dieta saludable, el ejercicio y la realizacin de pruebas o exmenes para Engineer, building services.  Siga las instrucciones del mdico con respecto al control del colesterol y la presin arterial. Esta informacin no tiene Marine scientist el consejo del mdico. Asegrese de hacerle al mdico cualquier pregunta que tenga. Document Released: 10/27/2011 Document Revised: 11/28/2018 Document Reviewed: 11/28/2018 Elsevier Patient Education  2020 [employer].

## 2019-10-23 ENCOUNTER — Other Ambulatory Visit: Payer: Self-pay | Admitting: Women's Health

## 2019-10-23 DIAGNOSIS — N920 Excessive and frequent menstruation with regular cycle: Secondary | ICD-10-CM

## 2019-10-23 LAB — PAP, TP IMAGING W/ HPV RNA, RFLX HPV TYPE 16,18/45: HPV DNA High Risk: DETECTED — AB

## 2019-10-23 LAB — CBC WITH DIFFERENTIAL/PLATELET
Absolute Monocytes: 959 cells/uL — ABNORMAL HIGH (ref 200–950)
Basophils Absolute: 42 cells/uL (ref 0–200)
Basophils Relative: 0.6 %
Eosinophils Absolute: 147 cells/uL (ref 15–500)
Eosinophils Relative: 2.1 %
HCT: 21.2 % — ABNORMAL LOW (ref 35.0–45.0)
Hemoglobin: 5.2 g/dL — CL (ref 11.7–15.5)
Lymphs Abs: 2079 cells/uL (ref 850–3900)
MCH: <15 pg — ABNORMAL LOW (ref 27.0–33.0)
MCHC: 24.5 g/dL — ABNORMAL LOW (ref 32.0–36.0)
MCV: 55.1 fL — ABNORMAL LOW (ref 80.0–100.0)
Monocytes Relative: 13.7 %
Neutro Abs: 3773 cells/uL (ref 1500–7800)
Neutrophils Relative %: 53.9 %
Platelets: 375 10*3/uL (ref 140–400)
RBC: 3.85 10*6/uL (ref 3.80–5.10)
RDW: 22.3 % — ABNORMAL HIGH (ref 11.0–15.0)
Total Lymphocyte: 29.7 %
WBC: 7 10*3/uL (ref 3.8–10.8)

## 2019-10-23 LAB — CBC MORPHOLOGY

## 2019-10-23 LAB — GLUCOSE, RANDOM: Glucose, Bld: 100 mg/dL — ABNORMAL HIGH (ref 65–99)

## 2019-11-06 ENCOUNTER — Other Ambulatory Visit (HOSPITAL_COMMUNITY): Payer: Self-pay | Admitting: *Deleted

## 2019-11-07 ENCOUNTER — Inpatient Hospital Stay (HOSPITAL_COMMUNITY): Admission: RE | Admit: 2019-11-07 | Payer: No Typology Code available for payment source | Source: Ambulatory Visit

## 2019-12-12 ENCOUNTER — Ambulatory Visit: Payer: Self-pay | Admitting: Obstetrics & Gynecology

## 2019-12-17 ENCOUNTER — Ambulatory Visit: Payer: Self-pay | Admitting: Obstetrics & Gynecology

## 2019-12-17 DIAGNOSIS — Z0289 Encounter for other administrative examinations: Secondary | ICD-10-CM

## 2020-12-29 ENCOUNTER — Other Ambulatory Visit: Payer: Self-pay

## 2020-12-29 ENCOUNTER — Ambulatory Visit (INDEPENDENT_AMBULATORY_CARE_PROVIDER_SITE_OTHER): Payer: Self-pay | Admitting: Family Medicine

## 2020-12-29 ENCOUNTER — Encounter: Payer: Self-pay | Admitting: Family Medicine

## 2020-12-29 VITALS — BP 130/82 | HR 86 | Temp 98.2°F | Ht 60.0 in | Wt 147.0 lb

## 2020-12-29 DIAGNOSIS — J3089 Other allergic rhinitis: Secondary | ICD-10-CM

## 2020-12-29 DIAGNOSIS — R8290 Unspecified abnormal findings in urine: Secondary | ICD-10-CM

## 2020-12-29 DIAGNOSIS — N3000 Acute cystitis without hematuria: Secondary | ICD-10-CM

## 2020-12-29 LAB — POCT URINALYSIS DIP (MANUAL ENTRY)
Bilirubin, UA: NEGATIVE
Blood, UA: NEGATIVE
Glucose, UA: NEGATIVE mg/dL
Ketones, POC UA: NEGATIVE mg/dL
Nitrite, UA: NEGATIVE
Protein Ur, POC: NEGATIVE mg/dL
Spec Grav, UA: 1.015 (ref 1.010–1.025)
Urobilinogen, UA: 0.2 E.U./dL
pH, UA: 6.5 (ref 5.0–8.0)

## 2020-12-29 LAB — POCT WET + KOH PREP
Trich by wet prep: ABSENT
Yeast by KOH: ABSENT
Yeast by wet prep: ABSENT

## 2020-12-29 MED ORDER — PREDNISONE 20 MG PO TABS: 20.0000 mg | ORAL_TABLET | Freq: Every day | ORAL | 0 refills | Status: AC

## 2020-12-29 MED ORDER — NITROFURANTOIN MONOHYD MACRO 100 MG PO CAPS: 100.0000 mg | ORAL_CAPSULE | Freq: Two times a day (BID) | ORAL | 0 refills | Status: AC

## 2020-12-29 NOTE — Patient Instructions (Addendum)
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 Allergens are things that can cause an allergic reaction. This condition affects the lining inside the nose (mucous membrane). There are two types of allergic rhinitis:  Seasonal. This type is also called hay fever. It happens only during some times of the year.  Perennial. This type can happen at any time of the year. This condition cannot be spread from person to person (is not contagious). It can be mild, worse, or very bad. It can develop at any age and may be outgrown. What are the causes? This condition may be caused by:  Pollen from grasses, trees, and weeds.  Dust mites.  Smoke.  Mold.  Car fumes.  The pee (urine), spit, or dander of pets. Dander is dead skin cells from a pet.   What increases the risk? You are more likely to develop this condition if:  You have allergies in your family.  You have problems like allergies in your family. You may have: ? Swelling of parts of your eyes and eyelids. ? Asthma. This affects how you breathe. ? Long-term redness and swelling on your skin. ? Food allergies. What are the signs or symptoms? The main symptom of this condition is a runny or stuffy nose (nasal congestion). Other symptoms may include:  Sneezing or coughing.  Itching and tearing of your eyes.  Mucus that drips down the back of your throat (postnasal drip).  Trouble sleeping.  Feeling tired.  Headache.  Sore throat. How is this treated? There is no cure for this condition. You should avoid things that you are allergic to. Treatment can help to relieve symptoms. This may include:  Medicines that  block allergy symptoms, such as corticosteroids or antihistamines. These may be given as a shot, nasal spray, or pill.  Avoiding things you are allergic to.  Medicines that give you bits of what you are allergic to over time. This is called immunotherapy. It is done if other treatments do not help. You may get: ? Shots. ? Medicine under your tongue.  Stronger medicines, if other treatments do not help. Follow these instructions at home: Avoiding allergens Find out what things you are allergic to and avoid them. To do this, try these things:  If you get allergies any time of year: ? Replace carpet with wood, tile, or vinyl flooring. Carpet can trap pet dander and dust. ? Do not smoke. Do not allow smoking in your home. ? Change your heating and air conditioning filters at least once a month.  If you get allergies only some times of the year: ? Keep windows closed when you can. ? Plan things to do outside when pollen counts are lowest. Check pollen counts before you plan things to do outside. ? When you come indoors, change your clothes and shower before you sit on furniture or bedding.   If you are allergic to a pet: ? Keep the pet out of your bedroom. ? Vacuum, sweep, and dust often.   General instructions  Take over-the-counter and prescription medicines only as told by your doctor.  Drink enough fluid to keep your pee (urine) pale yellow.  Keep all  follow-up visits as told by your doctor. This is important. Where to find more information  American Academy of Allergy, Asthma & Immunology: www.aaaai.org Contact a doctor if:  You have a fever.  You get a cough that does not go away.  You make whistling sounds when you breathe (wheeze).  Your symptoms slow you down.  Your symptoms stop you from doing your normal things each day. Get help right away if:  You are short of breath. This symptom may be an emergency. Do not wait to see if the symptom will go away. Get  medical help right away. Call your local emergency services (911 in the [place] Do not drive yourself to the hospital. Summary  Allergic rhinitis may be treated by taking medicines and avoiding things you are allergic to.  If you have allergies only some of the year, keep windows closed when you can at those times.  Contact your doctor if you get a fever or a cough that does not go away. This information is not intended to replace advice given to you by your health care provider. Make sure you discuss any questions you have with your health care provider. Document Revised: 12/30/2019 Document Reviewed: 11/05/2019 Elsevier Patient Education  2021 [employer].    If you have lab work done today you will be contacted with your lab results within the next 2 weeks.  If you have not heard from [place] then please contact us. The fastest way to get your results is to register for My Chart.   IF you received an x-ray today, you will receive an invoice from [facility]. Please contact [facility] at [phone] with questions or concerns regarding your invoice.   IF you received labwork today, you will receive an invoice from [facility]. Please contact [facility] at [phone] with questions or concerns regarding your invoice.   Our billing staff will not be able to assist you with questions regarding bills from these companies.  You will be contacted with the lab results as soon as they are available. The fastest way to get your results is to activate your My Chart account. Instructions are located on the last page of this paperwork. If you have not heard from [place] regarding the results in 2 weeks, please contact this office.

## 2020-12-29 NOTE — Progress Notes (Signed)
2/8/20222:50 PM  Brittany Frost 1976/10/04, 45 y.o., female [id number]  Chief Complaint  Patient presents with  . 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 strong smell in urine    HPI:   Patient is a 45 y.o. female with past medical history significant for allergies presents today for allergies.  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   Allergies  Allergen Reactions  . Other Hives and Itching    Allergic to flowers    Prior to Admission medications   Not on File    Past Medical History:  Diagnosis Date  . Allergy    pollen flowers,lavender  . High risk HPV infection   . Pregnancy induced hypertension   . 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 CESAREAN SECTION    . CO2 LASER OF VULVA AND VAGINA  06/01/2005  . MYOMECTOMY ABDOMINAL APPROACH      Social History   Tobacco Use  . Smoking status: Former Research scientist (life sciences)  . Smokeless tobacco: Never Used  Substance Use Topics  . Alcohol use: Yes    Alcohol/week: 0.0 standard drinks    Comment: occ    Family History  Problem Relation Age of Onset  . Hypertension Mother     Review of Systems  Constitutional: Negative for chills, fever and malaise/fatigue.  HENT: Negative for congestion, nosebleeds, sinus pain and sore throat.   Eyes: Negative for blurred vision and double vision.  Respiratory: Negative for cough, shortness of breath and  wheezing.   Cardiovascular: Negative for chest pain, palpitations and leg swelling.  Gastrointestinal: Negative for abdominal pain, blood in stool, constipation, diarrhea, heartburn, nausea and vomiting.  Genitourinary: Positive for flank pain. Negative for dysuria, frequency, hematuria and urgency.  Musculoskeletal: Negative for back pain and joint pain.  Skin: Negative for rash.  Neurological: Negative for dizziness, weakness and headaches.     OBJECTIVE:  Today's Vitals   12/29/20 1414  BP: 130/82  Pulse: 86  Temp: 98.2 F (36.8 C)  SpO2: 100%  Weight: 147 lb (66.7 kg)  Height: 5' (1.524 m)   Body mass index is 28.71 kg/m.   Physical Exam Constitutional:      General: She is not in acute distress.    Appearance: Normal appearance. She is not ill-appearing.  HENT:     Head: Normocephalic.     Nose: Nose normal. No congestion or rhinorrhea.     Mouth/Throat:     Mouth: Mucous membranes are moist.     Pharynx: Oropharynx is clear. No oropharyngeal exudate or posterior oropharyngeal erythema.  Eyes:     General:        Right eye: No discharge.        Left eye: No discharge.     Conjunctiva/sclera: Conjunctivae normal.     Pupils: Pupils are equal, round, and reactive to light.  Cardiovascular:     Rate and Rhythm: Normal rate and regular rhythm.     Pulses: Normal pulses.     Heart sounds: Normal heart sounds. No murmur heard. No friction rub. No gallop.   Pulmonary:     Effort: Pulmonary effort is normal. No respiratory distress.     Breath sounds: Normal breath sounds. No stridor. No wheezing, rhonchi or rales.  Abdominal:     General: Bowel sounds are normal.     Palpations: Abdomen is soft. There is no mass.     Tenderness: There is no abdominal tenderness. There is no right CVA tenderness, left CVA tenderness or guarding.  Musculoskeletal:     Right lower leg: No edema.     Left lower leg: No edema.  Skin:    General: Skin is warm and dry.  Neurological:      Mental Status: She is alert and oriented to person, place, and time.  Psychiatric:        Mood and Affect: Mood normal.        Behavior: Behavior normal.     Results for orders placed or performed in visit on 12/29/20 (from the past 24 hour(s))  POCT urinalysis dipstick     Status: Abnormal   Collection Time: 12/29/20  2:23 PM  Result Value Ref Range   Color, UA yellow yellow   Clarity, UA clear clear   Glucose, UA negative negative mg/dL   Bilirubin, UA negative negative   Ketones, POC UA negative negative mg/dL   Spec Grav, UA 1.015 1.010 - 1.025   Blood, UA negative negative   pH, UA 6.5 5.0 - 8.0   Protein Ur, POC negative negative mg/dL   Urobilinogen, UA 0.2 0.2 or 1.0 E.U./dL   Nitrite, UA Negative Negative   Leukocytes, UA Trace (A) Negative  POCT Wet + KOH Prep     Status: Abnormal   Collection Time: 12/29/20  2:29 PM  Result Value Ref Range   Yeast by KOH Absent Absent   Yeast by wet prep Absent Absent   WBC by wet prep None (A) Few   Clue Cells Wet Prep HPF POC None None   Trich by wet prep Absent Absent   Bacteria Wet Prep HPF POC Few Few   Epithelial Cells By [employer] Pref ([facility]) Few None, Few, Too numerous to count   RBC,UR,HPF,POC None None RBC/hpf    No results found.   ASSESSMENT and PLAN  Problem List Items Addressed This Visit   None   Visit Diagnoses    Foul smelling urine    -  Primary   Relevant Orders   POCT urinalysis dipstick (Completed)   POCT Wet + KOH Prep (Completed)   Non-seasonal allergic rhinitis due to other allergic trigger       Relevant Medications   predniSONE (DELTASONE) 20 MG tablet   Acute cystitis without hematuria       Relevant Medications   nitrofurantoin, macrocrystal-monohydrate, (MACROBID) 100 MG capsule     Plan Take Cetrizine (Zyrtec) take that the night before you have to work with flowers Make sure you have benadryl on hand at work Days where you will be working with flowers take 1 prednisone pill Use over  the counter hydrocortisone cream for itching  Take Antibiotics twice a day for 5 days   Return if symptoms worsen or fail to improve.   Huston Foley Yazleemar Strassner, FNP-BC [facility] [place], [place] [address] Ph.  [phone] Fax  [phone]
